# Patient Record
Sex: Female | Born: 1970
Health system: Southern US, Community
[De-identification: ages and names within clinical notes are randomized; demographics above are authoritative.]

## PROBLEM LIST (undated history)

## (undated) DIAGNOSIS — R6252 Short stature (child): Secondary | ICD-10-CM

## (undated) DIAGNOSIS — Z8619 Personal history of other infectious and parasitic diseases: Secondary | ICD-10-CM

## (undated) DIAGNOSIS — K5909 Other constipation: Secondary | ICD-10-CM

## (undated) DIAGNOSIS — O09529 Supervision of elderly multigravida, unspecified trimester: Secondary | ICD-10-CM

## (undated) DIAGNOSIS — Z8669 Personal history of other diseases of the nervous system and sense organs: Secondary | ICD-10-CM

## (undated) DIAGNOSIS — D649 Anemia, unspecified: Secondary | ICD-10-CM

## (undated) DIAGNOSIS — IMO0002 Reserved for concepts with insufficient information to code with codable children: Secondary | ICD-10-CM

## (undated) HISTORY — DX: Short stature (child): R62.52

## (undated) HISTORY — DX: Personal history of other diseases of the nervous system and sense organs: Z86.69

## (undated) HISTORY — DX: Personal history of other infectious and parasitic diseases: Z86.19

## (undated) HISTORY — DX: Supervision of elderly multigravida, unspecified trimester: O09.529

## (undated) HISTORY — DX: Other constipation: K59.09

## (undated) HISTORY — DX: Reserved for concepts with insufficient information to code with codable children: IMO0002

## (undated) HISTORY — DX: Anemia, unspecified: D64.9

---
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
## 1993-09-03 HISTORY — PX: OTHER SURGICAL HISTORY: SHX169

## 2000-12-27 ENCOUNTER — Encounter: Payer: Self-pay | Admitting: Infectious Diseases

## 2000-12-27 ENCOUNTER — Ambulatory Visit (HOSPITAL_COMMUNITY): Admission: RE | Admit: 2000-12-27 | Discharge: 2000-12-27 | Payer: Self-pay | Admitting: Infectious Diseases

## 2004-05-30 ENCOUNTER — Other Ambulatory Visit: Admission: RE | Admit: 2004-05-30 | Discharge: 2004-05-30 | Payer: Self-pay | Admitting: Obstetrics and Gynecology

## 2005-07-15 ENCOUNTER — Inpatient Hospital Stay (HOSPITAL_COMMUNITY): Admission: AD | Admit: 2005-07-15 | Discharge: 2005-07-15 | Payer: Self-pay | Admitting: Obstetrics and Gynecology

## 2005-07-23 ENCOUNTER — Encounter: Admission: RE | Admit: 2005-07-23 | Discharge: 2005-09-02 | Payer: Self-pay | Admitting: Obstetrics and Gynecology

## 2005-09-26 ENCOUNTER — Inpatient Hospital Stay (HOSPITAL_COMMUNITY): Admission: AD | Admit: 2005-09-26 | Discharge: 2005-09-28 | Payer: Self-pay | Admitting: Obstetrics and Gynecology

## 2006-02-18 ENCOUNTER — Emergency Department (HOSPITAL_COMMUNITY): Admission: EM | Admit: 2006-02-18 | Discharge: 2006-02-18 | Payer: Self-pay | Admitting: Family Medicine

## 2006-07-02 ENCOUNTER — Other Ambulatory Visit: Admission: RE | Admit: 2006-07-02 | Discharge: 2006-07-02 | Payer: Self-pay | Admitting: Obstetrics and Gynecology

## 2006-09-27 ENCOUNTER — Emergency Department (HOSPITAL_COMMUNITY): Admission: EM | Admit: 2006-09-27 | Discharge: 2006-09-27 | Payer: Self-pay | Admitting: Family Medicine

## 2007-01-01 ENCOUNTER — Emergency Department (HOSPITAL_COMMUNITY): Admission: EM | Admit: 2007-01-01 | Discharge: 2007-01-01 | Payer: Self-pay | Admitting: Family Medicine

## 2007-01-27 ENCOUNTER — Emergency Department (HOSPITAL_COMMUNITY): Admission: EM | Admit: 2007-01-27 | Discharge: 2007-01-27 | Payer: Self-pay | Admitting: Family Medicine

## 2007-09-05 ENCOUNTER — Ambulatory Visit (HOSPITAL_COMMUNITY): Admission: RE | Admit: 2007-09-05 | Discharge: 2007-09-05 | Payer: Self-pay | Admitting: Emergency Medicine

## 2007-09-05 ENCOUNTER — Emergency Department (HOSPITAL_COMMUNITY): Admission: EM | Admit: 2007-09-05 | Discharge: 2007-09-05 | Payer: Self-pay | Admitting: Emergency Medicine

## 2008-06-08 ENCOUNTER — Ambulatory Visit (HOSPITAL_COMMUNITY): Admission: RE | Admit: 2008-06-08 | Discharge: 2008-06-08 | Payer: Self-pay | Admitting: Neurology

## 2008-06-08 ENCOUNTER — Other Ambulatory Visit: Admission: RE | Admit: 2008-06-08 | Discharge: 2008-06-08 | Payer: Self-pay | Admitting: Family Medicine

## 2008-10-29 ENCOUNTER — Emergency Department (HOSPITAL_COMMUNITY): Admission: EM | Admit: 2008-10-29 | Discharge: 2008-10-29 | Payer: Self-pay | Admitting: Emergency Medicine

## 2009-12-27 ENCOUNTER — Ambulatory Visit (HOSPITAL_COMMUNITY): Admission: RE | Admit: 2009-12-27 | Discharge: 2009-12-27 | Payer: Self-pay | Admitting: Family Medicine

## 2010-07-19 ENCOUNTER — Encounter
Admission: RE | Admit: 2010-07-19 | Discharge: 2010-07-19 | Payer: Self-pay | Source: Home / Self Care | Attending: Obstetrics and Gynecology | Admitting: Obstetrics and Gynecology

## 2010-09-13 ENCOUNTER — Inpatient Hospital Stay (HOSPITAL_COMMUNITY)
Admission: AD | Admit: 2010-09-13 | Discharge: 2010-09-15 | Payer: Self-pay | Source: Home / Self Care | Attending: Obstetrics and Gynecology | Admitting: Obstetrics and Gynecology

## 2010-09-18 LAB — CBC
HCT: 38.3 % (ref 36.0–46.0)
HCT: 30.2 % — ABNORMAL LOW (ref 36.0–46.0)
Hemoglobin: 13.2 g/dL (ref 12.0–15.0)
Hemoglobin: 10.2 g/dL — ABNORMAL LOW (ref 12.0–15.0)
MCH: 28.8 pg (ref 26.0–34.0)
MCH: 28.4 pg (ref 26.0–34.0)
MCHC: 34.5 g/dL (ref 30.0–36.0)
MCHC: 33.8 g/dL (ref 30.0–36.0)
MCV: 83.4 fL (ref 78.0–100.0)
MCV: 84.1 fL (ref 78.0–100.0)
Platelets: 161 10*3/uL (ref 150–400)
Platelets: 142 10*3/uL — ABNORMAL LOW (ref 150–400)
RBC: 4.59 MIL/uL (ref 3.87–5.11)
RBC: 3.59 MIL/uL — ABNORMAL LOW (ref 3.87–5.11)
RDW: 15.1 % (ref 11.5–15.5)
RDW: 15.3 % (ref 11.5–15.5)
WBC: 11.6 10*3/uL — ABNORMAL HIGH (ref 4.0–10.5)
WBC: 12.9 10*3/uL — ABNORMAL HIGH (ref 4.0–10.5)

## 2010-09-18 LAB — GLUCOSE, CAPILLARY
Glucose-Capillary: 81 mg/dL (ref 70–99)
Glucose-Capillary: 86 mg/dL (ref 70–99)
Glucose-Capillary: 118 mg/dL — ABNORMAL HIGH (ref 70–99)
Glucose-Capillary: 89 mg/dL (ref 70–99)

## 2010-09-18 LAB — RPR: RPR Ser Ql: NONREACTIVE

## 2010-09-24 ENCOUNTER — Encounter: Payer: Self-pay | Admitting: Family Medicine

## 2010-09-24 ENCOUNTER — Encounter: Payer: Self-pay | Admitting: Neurology

## 2010-12-12 ENCOUNTER — Other Ambulatory Visit (HOSPITAL_COMMUNITY): Payer: Self-pay | Admitting: Neurosurgery

## 2010-12-12 DIAGNOSIS — M545 Low back pain: Secondary | ICD-10-CM

## 2010-12-14 ENCOUNTER — Ambulatory Visit (HOSPITAL_COMMUNITY)
Admission: RE | Admit: 2010-12-14 | Discharge: 2010-12-14 | Disposition: A | Discharge status: Home / Self Care | Payer: 59 | Source: Ambulatory Visit | Attending: Neurosurgery | Admitting: Neurosurgery

## 2010-12-14 DIAGNOSIS — M48061 Spinal stenosis, lumbar region without neurogenic claudication: Secondary | ICD-10-CM | POA: Insufficient documentation

## 2010-12-14 DIAGNOSIS — M169 Osteoarthritis of hip, unspecified: Secondary | ICD-10-CM | POA: Insufficient documentation

## 2010-12-14 DIAGNOSIS — M545 Low back pain, unspecified: Secondary | ICD-10-CM

## 2010-12-14 DIAGNOSIS — R609 Edema, unspecified: Secondary | ICD-10-CM | POA: Insufficient documentation

## 2010-12-14 DIAGNOSIS — M1610 Unilateral primary osteoarthritis, unspecified hip: Secondary | ICD-10-CM | POA: Insufficient documentation

## 2010-12-14 DIAGNOSIS — M519 Unspecified thoracic, thoracolumbar and lumbosacral intervertebral disc disorder: Secondary | ICD-10-CM | POA: Insufficient documentation

## 2010-12-14 DIAGNOSIS — M51379 Other intervertebral disc degeneration, lumbosacral region without mention of lumbar back pain or lower extremity pain: Secondary | ICD-10-CM | POA: Insufficient documentation

## 2010-12-14 DIAGNOSIS — M5137 Other intervertebral disc degeneration, lumbosacral region: Secondary | ICD-10-CM | POA: Insufficient documentation

## 2010-12-19 LAB — DIFFERENTIAL
Basophils Absolute: 0 10*3/uL (ref 0.0–0.1)
Basophils Relative: 1 % (ref 0–1)
Eosinophils Absolute: 0.1 10*3/uL (ref 0.0–0.7)
Eosinophils Relative: 2 % (ref 0–5)
Lymphocytes Relative: 22 % (ref 12–46)
Lymphs Abs: 1.6 10*3/uL (ref 0.7–4.0)
Monocytes Absolute: 0.4 10*3/uL (ref 0.1–1.0)
Monocytes Relative: 6 % (ref 3–12)
Neutro Abs: 5.1 10*3/uL (ref 1.7–7.7)
Neutrophils Relative %: 70 % (ref 43–77)

## 2010-12-19 LAB — CBC
HCT: 36 % (ref 36.0–46.0)
Hemoglobin: 12.6 g/dL (ref 12.0–15.0)
MCHC: 34.8 g/dL (ref 30.0–36.0)
MCV: 87.7 fL (ref 78.0–100.0)
Platelets: 267 10*3/uL (ref 150–400)
RBC: 4.11 MIL/uL (ref 3.87–5.11)
RDW: 12.9 % (ref 11.5–15.5)
WBC: 7.2 10*3/uL (ref 4.0–10.5)

## 2010-12-19 LAB — URINALYSIS, ROUTINE W REFLEX MICROSCOPIC
Bilirubin Urine: NEGATIVE
Glucose, UA: NEGATIVE mg/dL
Hgb urine dipstick: NEGATIVE
Ketones, ur: NEGATIVE mg/dL
Nitrite: NEGATIVE
Protein, ur: NEGATIVE mg/dL
Specific Gravity, Urine: 1.014 (ref 1.005–1.030)
Urobilinogen, UA: 0.2 mg/dL (ref 0.0–1.0)
pH: 6 (ref 5.0–8.0)

## 2010-12-19 LAB — COMPREHENSIVE METABOLIC PANEL
ALT: 15 U/L (ref 0–35)
AST: 23 U/L (ref 0–37)
Albumin: 3.8 g/dL (ref 3.5–5.2)
Alkaline Phosphatase: 52 U/L (ref 39–117)
BUN: 9 mg/dL (ref 6–23)
CO2: 27 mEq/L (ref 19–32)
Calcium: 9.1 mg/dL (ref 8.4–10.5)
Chloride: 105 mEq/L (ref 96–112)
Creatinine, Ser: 0.77 mg/dL (ref 0.4–1.2)
GFR calc Af Amer: >60 mL/min (ref >60)
GFR calc non Af Amer: >60 mL/min (ref >60)
Glucose, Bld: 172 mg/dL — ABNORMAL HIGH (ref 70–99)
Potassium: 3.4 mEq/L — ABNORMAL LOW (ref 3.5–5.1)
Sodium: 137 mEq/L (ref 135–145)
Total Bilirubin: 0.3 mg/dL (ref 0.3–1.2)
Total Protein: 6.8 g/dL (ref 6.0–8.3)

## 2010-12-19 LAB — PREGNANCY, URINE: Preg Test, Ur: NEGATIVE

## 2010-12-19 LAB — LIPASE, BLOOD: Lipase: 40 U/L (ref 11–59)

## 2010-12-19 LAB — AMYLASE: Amylase: 124 U/L (ref 27–131)

## 2011-01-19 NOTE — H&P (Signed)
NAMEPRISTINE, GLADHILL              ACCOUNT NO.:  [id number]   MEDICAL RECORD NO.:  [id number]          PATIENT TYPE:  INP   LOCATION:  9170                          FACILITY:  [facility]   PHYSICIAN:  Debra Martin, M.D. DATE OF BIRTH:  05-Aug-1971   DATE OF ADMISSION:  09/26/2005  DATE OF DISCHARGE:                                HISTORY & PHYSICAL   Debra Martin is a 40 year old gravida 1, para 0, at 23 weeks, who presents  for induction secondary to gestational diabetes, diet-controlled.  She  reports uterine contractions every four to eight minutes, 60 seconds in  duration, mild to moderate quality, since approximately 3 a.m.  She also  reports CBGs have all been within normal limits.  The pregnancy has been  remarkable for:   1.  Gestational diabetes, diet-controlled.  2.  History of migraines.  3.  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  4.  Marginal cord insertion on ultrasound.   PRENATAL LABORATORY DATA:  Blood type is O positive, Rh antibody  negative.  VDRL nonreactive.  Rubella titer positive.  Hepatitis B surface antigen  negative.  HIV nonreactive.  Sickle cell test was negative.  Cystic fibrosis  testing was negative.  Pap was normal in September 2005.  GC and Chlamydia  cultures were declined.  The patient also declined first trimester  screening.  Hemoglobin upon entering the practice was 13.2.  It was 10.3 at  26 weeks.  The patient's one-hour Glucola was elevated at 147.  Her three-  hour GTT was abnormal except for a fasting blood sugar of 89.  She was  referred to nutrition management center.  She reports all her fasting blood  sugars and two-hour postprandials have been within normal limits.  Group B  strep culture was negative at 36 weeks.  Quadruple screen was declined.  EDC  of October 03, 2005, was established by last menstrual period and was in  agreement with ultrasound at approximately 18 weeks.   HISTORY OF PRESENT PREGNANCY:   The patient entered care at approximately 10  weeks.  She denied first trimester screening and quadruple screen.  She was  on Reglan for nausea.  She was given Midrin for migraines.  She was also  evaluated by an ophthalmologist, which was normal.  She had an ultrasound at  19 weeks showing normal growth and development.  There was a questionable  marginal insertion of the umbilical cord.  This was reevaluated at 22 weeks.  This was confirmed at [facility], and she had another ultrasound at 30  weeks showing normal growth at the 25th percentile.  She had another follow-  up at 36 weeks showing growth at the 11th percentile.  GC and Chlamydia were  negative at 36 weeks.  The baby was breech at 34 weeks but then was vertex  following.  The rest of her pregnancy was essentially uncomplicated.   OBSTETRICAL HISTORY:  The patient is a primigravida.   MEDICAL HISTORY:  She reports the usual childhood illnesses.  She has a  history of migraines.  She has no known medication allergies.   FAMILY HISTORY:  Her brother has heart disease.  Her paternal uncle is  hypertensive on medications.  Her paternal uncle also had a stroke.   SURGICAL HISTORY:  Right breast benign cyst removal in 1995.   GENETIC HISTORY:  Unremarkable.   SOCIAL HISTORY:  The patient is married to the father of the baby.  He is  involved and supportive.  His name is Debra Martin.  The patient is college-  educated.  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  Her husband is also college-educated and is a Engineer, civil (consulting) who works in the  outpatient clinic at [employer].  He is also a Education officer, environmental.  The patient denies  any alcohol, drug or tobacco use during this pregnancy.  She has been  followed by the physician service at [facility].   PHYSICAL EXAMINATION:  VITAL SIGNS:  Stable.  The patient is afebrile.  HEENT:  Within normal  limits.  LUNGS:  Bilateral breath sounds are clear.  CARDIAC:  Regular rate and rhythm without murmur.  BREASTS:  Soft and nontender.  ABDOMEN:  Fundal height is approximately 37 cm.  Estimated fetal weight is 6  pounds.  Uterine contractions are every four to eight minutes, mild to  moderate quality.  PELVIC:  Cervix is 1 cm, 80%, vertex at a -2 station.  EXTREMITIES:  Deep tendon reflexes are 2+ without clonus.  There is a trace  edema noted.   IMPRESSION:  1.  Intrauterine pregnancy at 39 weeks.  2.  Gestational diabetes.   PLAN:  1.  Admit to birthing suite per consult with Dr. Normand Martin as attending      physician.  2.  Routine physician orders.  3.  Plan Pitocin per low-dose protocol.  4.  Patient desires epidural as labor progresses.  5.  Check serum glucose with admit labs and CBGs q.4h.      Debra Martin, C.N.M.      Debra Martin, M.D.  Electronically Signed    VLL/MEDQ  D:  09/26/2005  T:  09/26/2005  Job:  [id number]

## 2012-03-18 ENCOUNTER — Other Ambulatory Visit: Payer: Self-pay | Admitting: Obstetrics and Gynecology

## 2012-03-18 ENCOUNTER — Ambulatory Visit (INDEPENDENT_AMBULATORY_CARE_PROVIDER_SITE_OTHER): Payer: 59 | Admitting: Obstetrics and Gynecology

## 2012-03-18 ENCOUNTER — Ambulatory Visit: Payer: Self-pay | Admitting: Obstetrics and Gynecology

## 2012-03-18 ENCOUNTER — Encounter: Payer: Self-pay | Admitting: Obstetrics and Gynecology

## 2012-03-18 VITALS — BP 90/58 | HR 84 | Ht 59.0 in | Wt 119.0 lb

## 2012-03-18 DIAGNOSIS — M549 Dorsalgia, unspecified: Secondary | ICD-10-CM

## 2012-03-18 DIAGNOSIS — Z124 Encounter for screening for malignant neoplasm of cervix: Secondary | ICD-10-CM

## 2012-03-18 DIAGNOSIS — Z1231 Encounter for screening mammogram for malignant neoplasm of breast: Secondary | ICD-10-CM

## 2012-03-18 DIAGNOSIS — G8929 Other chronic pain: Secondary | ICD-10-CM

## 2012-03-18 NOTE — Progress Notes (Signed)
Last Pap: 02/20/11 WNL: Yes Regular Periods:yes Contraception: Paragard inserted 11/2010  Monthly Breast exam:yes Tetanus<41yrs:yes Nl.Bladder Function:yes Daily BMs:yes Healthy Diet:yes Calcium:yes Mammogram:yes Date of Mammogram: 2011 per pt Exercise:yes Have often Exercise: sometimes Seatbelt: yes Abuse at home: no Stressful work: no Sigmoid-colonoscopy: n/a Bone Density: No PCP: Dr. Sigmund Hazel Change in PMH: none Change in ZOX:WRUE Pt without complaints Physical Examination: General appearance - alert, well appearing, and in no distress Mental status - normal mood, behavior, speech, dress, motor activity, and thought processes Neck - supple, no significant adenopathy, thyroid exam: thyroid is normal in size without nodules or tenderness Chest - clear to auscultation, no wheezes, rales or rhonchi, symmetric air entry Heart - normal rate and regular rhythm Abdomen - soft, nontender, nondistended, no masses or organomegaly Breasts - breasts appear normal, no suspicious masses, no skin or nipple changes or axillary nodes Pelvic - normal external genitalia, vulva, vagina, cervix, uterus and adnexa Rectal - normal rectal, no masses, rectal exam not indicated Back exam - full range of motion, no tenderness, palpable spasm or pain on motion Neurological - alert, oriented, normal speech, no focal findings or movement disorder noted Musculoskeletal - no joint tenderness, deformity or swelling Extremities - no edema, redness or tenderness in the calves or thighs Skin - normal coloration and turgor, no rashes, no suspicious skin lesions noted Routine exam Pap sent yes next one due in 3 yrs Mammogram due yes pt will schedule it  paragard in place RT 1 yr

## 2012-03-19 LAB — PAP IG W/ RFLX HPV ASCU

## 2012-04-08 ENCOUNTER — Ambulatory Visit (HOSPITAL_COMMUNITY)
Admission: RE | Admit: 2012-04-08 | Discharge: 2012-04-08 | Disposition: A | Discharge status: Home / Self Care | Payer: 59 | Source: Ambulatory Visit | Attending: Obstetrics and Gynecology | Admitting: Obstetrics and Gynecology

## 2012-04-08 DIAGNOSIS — Z1231 Encounter for screening mammogram for malignant neoplasm of breast: Secondary | ICD-10-CM | POA: Insufficient documentation

## 2012-06-16 ENCOUNTER — Encounter: Payer: Self-pay | Admitting: Obstetrics and Gynecology

## 2013-03-17 ENCOUNTER — Other Ambulatory Visit (HOSPITAL_BASED_OUTPATIENT_CLINIC_OR_DEPARTMENT_OTHER): Payer: Self-pay | Admitting: Family Medicine

## 2013-03-17 DIAGNOSIS — Z1231 Encounter for screening mammogram for malignant neoplasm of breast: Secondary | ICD-10-CM

## 2013-04-15 ENCOUNTER — Ambulatory Visit (HOSPITAL_BASED_OUTPATIENT_CLINIC_OR_DEPARTMENT_OTHER)
Admission: RE | Admit: 2013-04-15 | Discharge: 2013-04-15 | Disposition: A | Discharge status: Home / Self Care | Payer: 59 | Source: Ambulatory Visit | Attending: Family Medicine | Admitting: Family Medicine

## 2013-04-15 DIAGNOSIS — Z1231 Encounter for screening mammogram for malignant neoplasm of breast: Secondary | ICD-10-CM | POA: Insufficient documentation

## 2013-08-11 ENCOUNTER — Other Ambulatory Visit (HOSPITAL_COMMUNITY): Payer: Self-pay | Admitting: Otolaryngology

## 2013-08-11 DIAGNOSIS — H6980 Other specified disorders of Eustachian tube, unspecified ear: Secondary | ICD-10-CM

## 2013-08-11 DIAGNOSIS — H6981 Other specified disorders of Eustachian tube, right ear: Secondary | ICD-10-CM

## 2013-08-20 ENCOUNTER — Ambulatory Visit (HOSPITAL_COMMUNITY)
Admission: RE | Admit: 2013-08-20 | Discharge: 2013-08-20 | Disposition: A | Discharge status: Home / Self Care | Payer: 59 | Source: Ambulatory Visit | Attending: Otolaryngology | Admitting: Otolaryngology

## 2013-08-20 DIAGNOSIS — H6981 Other specified disorders of Eustachian tube, right ear: Secondary | ICD-10-CM

## 2013-08-20 DIAGNOSIS — H6980 Other specified disorders of Eustachian tube, unspecified ear: Secondary | ICD-10-CM | POA: Insufficient documentation

## 2013-08-20 DIAGNOSIS — H6990 Unspecified Eustachian tube disorder, unspecified ear: Secondary | ICD-10-CM | POA: Insufficient documentation

## 2013-08-20 DIAGNOSIS — H9319 Tinnitus, unspecified ear: Secondary | ICD-10-CM | POA: Insufficient documentation

## 2014-03-16 ENCOUNTER — Other Ambulatory Visit (HOSPITAL_BASED_OUTPATIENT_CLINIC_OR_DEPARTMENT_OTHER): Payer: Self-pay | Admitting: Family Medicine

## 2014-03-16 DIAGNOSIS — Z1231 Encounter for screening mammogram for malignant neoplasm of breast: Secondary | ICD-10-CM

## 2014-04-20 ENCOUNTER — Ambulatory Visit (HOSPITAL_COMMUNITY)
Admission: RE | Admit: 2014-04-20 | Discharge: 2014-04-20 | Disposition: A | Discharge status: Home / Self Care | Payer: 59 | Source: Ambulatory Visit | Attending: Family Medicine | Admitting: Family Medicine

## 2014-04-20 DIAGNOSIS — Z1231 Encounter for screening mammogram for malignant neoplasm of breast: Secondary | ICD-10-CM | POA: Insufficient documentation

## 2014-07-05 ENCOUNTER — Encounter: Payer: Self-pay | Admitting: Obstetrics and Gynecology

## 2014-09-03 ENCOUNTER — Encounter (HOSPITAL_COMMUNITY): Payer: Self-pay | Admitting: Emergency Medicine

## 2014-09-03 ENCOUNTER — Emergency Department (HOSPITAL_COMMUNITY)
Admission: EM | Admit: 2014-09-03 | Discharge: 2014-09-03 | Disposition: A | Discharge status: Home / Self Care | Payer: 59 | Source: Home / Self Care | Attending: Emergency Medicine | Admitting: Emergency Medicine

## 2014-09-03 DIAGNOSIS — J019 Acute sinusitis, unspecified: Secondary | ICD-10-CM

## 2014-09-03 DIAGNOSIS — J040 Acute laryngitis: Secondary | ICD-10-CM

## 2014-09-03 MED ORDER — HYDROCOD POLST-CHLORPHEN POLST 10-8 MG/5ML PO LQCR: 5.0000 mL | Freq: Two times a day (BID) | ORAL | Status: DC | PRN

## 2014-09-03 MED ORDER — AMOXICILLIN-POT CLAVULANATE 875-125 MG PO TABS: 1.0000 | ORAL_TABLET | Freq: Two times a day (BID) | ORAL | Status: DC

## 2014-09-03 MED ORDER — IPRATROPIUM BROMIDE 0.06 % NA SOLN: 2.0000 | Freq: Four times a day (QID) | NASAL | Status: DC

## 2014-09-03 MED ORDER — PREDNISONE 20 MG PO TABS: 20.0000 mg | ORAL_TABLET | Freq: Two times a day (BID) | ORAL | Status: DC

## 2014-09-03 MED ORDER — ALBUTEROL SULFATE HFA 108 (90 BASE) MCG/ACT IN AERS: 1.0000 | INHALATION_SPRAY | Freq: Four times a day (QID) | RESPIRATORY_TRACT | Status: DC | PRN

## 2014-09-03 NOTE — ED Provider Notes (Signed)
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 She denies any fever, chills, headache, sore throat, stiff neck, or GI symptoms.  Review of Systems   Other than as noted above, the patient denies any of the following symptoms: Systemic:  No fevers, chills, sweats, or myalgias. Eye:  No redness or discharge. ENT:  No ear pain, headache, nasal congestion, drainage, sinus pressure, or sore throat. Neck:  No neck pain, stiffness, or swollen glands. Lungs:  No cough, sputum production, hemoptysis, wheezing, chest tightness, shortness of breath or chest pain. GI:  No abdominal pain, nausea, vomiting or diarrhea.  [place]   Past medical history, family history, social history, meds, and allergies were reviewed. Her only medication is Zyrtec.  Physical exam   Vital signs:  BP 127/82 mmHg  Pulse 97  Temp(Src) 98.4 F (36.9 C) (Oral)  Resp 16  SpO2 99%  LMP 08/12/2014 General:  Alert and oriented.  In no distress.  Skin warm and dry. Eye:  No conjunctival injection or drainage. Lids were normal. ENT:  TMs and canals were normal, without erythema or inflammation.  Nasal mucosa was clear and uncongested, without drainage.  Mucous membranes were moist.  Pharynx was clear with no exudate or drainage.  There were no oral ulcerations or lesions. Neck:  Supple, no adenopathy, tenderness or mass. Lungs:  No respiratory distress.  Lungs were clear to auscultation, without wheezes, rales or rhonchi.  Breath sounds were clear and equal bilaterally.  Heart:  Regular rhythm, without gallops, murmers or rubs. Skin:  Clear, warm, and dry, without rash or lesions.  Assessment     The primary encounter diagnosis was Acute sinusitis, recurrence not specified, unspecified location. A diagnosis of Laryngitis was also  pertinent to this visit.  Plan    1.  Meds:  The following meds were prescribed:   Discharge Medication List as of 09/03/2014 10:41 AM    START taking these medications   Details  albuterol (PROVENTIL HFA;VENTOLIN HFA) 108 (90 BASE) MCG/ACT inhaler Inhale 1-2 puffs into the lungs every 6 (six) hours as needed for wheezing or shortness of breath., Starting 09/03/2014, Until Discontinued, Normal    amoxicillin-clavulanate (AUGMENTIN) 875-125 MG per tablet Take 1 tablet by mouth 2 (two) times daily., Starting 09/03/2014, Until Discontinued, Normal    chlorpheniramine-HYDROcodone (TUSSIONEX) 10-8 MG/5ML LQCR Take 5 mLs by mouth every 12 (twelve) hours as needed for cough., Starting 09/03/2014, Until Discontinued, Normal    ipratropium (ATROVENT) 0.06 % nasal spray Place 2 sprays into both nostrils 4 (four) times daily., Starting 09/03/2014, Until Discontinued, Normal    predniSONE (DELTASONE) 20 MG tablet Take 1 tablet (20 mg total) by mouth 2 (two) times daily., Starting 09/03/2014, Until Discontinued, Normal        2.  Patient Education/Counseling:  The patient was given appropriate handouts, self care instructions, and instructed in symptomatic relief.  Instructed to get extra fluids and extra rest.    3.  Follow up:  The patient was told to follow up here if no better in 3 to 4 days, or sooner if becoming worse in any way, and given some red flag symptoms such as increasing fever, difficulty breathing, chest pain, or persistent vomiting which would prompt immediate return.       Harden Mo, MD 09/03/14  1105 

## 2014-09-03 NOTE — Discharge Instructions (Signed)
Most upper respiratory infections are caused by viruses and do not require antibiotics.  We try to save the antibiotics for when we really need them to prevent bacteria from developing resistance to them.  Here are a few hints about things that can be done at home to help get over an upper respiratory infection quicker: ° °Get extra sleep and extra fluids.  Get 7 to 9 hours of sleep per night and 6 to 8 glasses of water a day.  Getting extra sleep keeps the immune system from getting run down.  Most people with an upper respiratory infection are a little dehydrated.  The extra fluids also keep the secretions liquified and easier to deal with.  Also, get extra vitamin C.  4000 mg per day is the recommended dose. °For the aches, headache, and fever, acetaminophen or ibuprofen are helpful.  These can be alternated every 4 hours.  People with liver disease should avoid large amounts of acetaminophen, and people with ulcer disease, gastroesophageal reflux, gastritis, congestive heart failure, chronic kidney disease, coronary artery disease and the elderly should avoid ibuprofen. °For nasal congestion try Mucinex-D, or if you're having lots of sneezing or clear nasal drainage use Zyrtec-D. People with high blood pressure can take these if their blood pressure is controlled, if not, it's best to avoid the forms with a "D" (decongestants).  You can use the plain Mucinex, Allegra, Claritin, or Zyrtec even if your blood pressure is not controlled.   °A Saline nasal spray such as Ocean Spray can also help.  You can add a decongestant sprays such as Afrin, but you should not use the decongestant sprays for more than 3 or 4 days since they can be habituating.  Breathe Rite nasal strips can also offer a non-drug alternative treatment to nasal congestion, especially at night. °For people with symptoms of sinusitis, sleeping with your head elevated can be helpful.  For sinus pain, moist, hot compresses to the face may provide some  relief.  Many people find that inhaling steam as in a shower or from a pot of steaming water can help. °For any viral infection, zinc containing lozenges such as Cold-Eze or Zicam are helpful.  Zinc helps to fight viral infection.  Hot salt water gargles (8 oz of hot water, 1/2 tsp of table salt, and a pinch of baking soda) can give relief as well as hot beverages such as hot tea.  Sucrets extra strength lozenges will help the sore throat.  °For the cough, take Delsym 2 tsp every 12 hours.  It has also been found recently that Aleve can help control a cough.  The dose is 1 to 2 tablets twice daily with food.  This can be combined with Delsym. (Note, if you are taking ibuprofen, you should not take Aleve as well--take one or the other.) °A cool mist vaporizer will help keep your mucous membranes from drying out.  ° °It's important when you have an upper respiratory infection not to pass the infection to others.  This involves being very careful about the following: ° °Frequent hand washing or use of hand sanitizer, especially after coughing, sneezing, blowing your nose or touching your face, nose or eyes. °Do not shake hands or touch anyone and try to avoid touching surfaces that other people use such as doorknobs, shopping carts, telephones and computer keyboards. °Use tissues and dispose of them properly in a garbage can or ziplock bag. °Cough into your sleeve. °Do not let others eat or   drink after you. ° °It's also important to recognize the signs of serious illness and get evaluated if they occur: °Any respiratory infection that lasts more than 7 to 10 days.  Yellow nasal drainage and sputum are not reliable indicators of a bacterial infection, but if they last for more than 1 week, see your doctor. °Fever and sore throat can indicate strep. °Fever and cough can indicate influenza or pneumonia. °Any kind of severe symptom such as difficulty breathing, intractable vomiting, or severe pain should prompt you to see  a doctor as soon as possible. ° ° °Your body's immune system is really the thing that will get rid of this infection.  Your immune system is comprised of 2 types of specialized cells called T cells and B cells.  T cells coordinate the array of cells in your body that engulf invading bacteria or viruses while B cells orchestrate the production of antibodies that neutralize infection.  Anything we do or any medications we give you, will just strengthen your immune system or help it clear up the infection quicker.  Here are a few helpful hints to improve your immune system to help overcome this illness or to prevent future infections: °· A few vitamins can improve the health of your immune system.  That's why your diet should include plenty of fruits, vegetables, fish, nuts, and whole grains. °· Vitamin A and bet-carotene can increase the cells that fight infections (T cells and B cells).  Vitamin A is abundant in dark greens and orange vegetables such as spinach, greens, sweet potatoes, and carrots. °· Vitamin B6 contributes to the maturation of white blood cells, the cells that fight disease.  Foods with vitamin B6 include cold cereal and bananas. °· Vitamin C is credited with preventing colds because it increases white blood cells and also prevents cellular damage.  Citrus fruits, peaches and green and red bell peppers are all hight in vitamin C. °· Vitamin E is an anti-oxidant that encourages the production of natural killer cells which reject foreign invaders and B cells that produce antibodies.  Foods high in vitamin E include wheat germ, nuts and seeds. °· Foods high in omega-3 fatty acids found in foods like salmon, tuna and mackerel boost your immune system and help cells to engulf and absorb germs. °· Probiotics are good bacteria that increase your T cells.  These can be found in yogurt and are available in supplements such as Culturelle or Align. °· Moderate exercise increases the strength of your immune  system and your ability to recover from illness.  I suggest 3 to 5 moderate intensity 30 minute workouts per week.   °· Sleep is another component of maintaining a strong immune system.  It enables your body to recuperate from the day's activities, stress and work.  My recommendation is to get between 7 and 9 hours of sleep per night. °· If you smoke, try to quit completely or at least cut down.  Drink alcohol only in moderation if at all.  No more than 2 drinks daily for men or 1 for women. °· Get a flu vaccine early in the fall or if you have not gotten one yet, once this illness has run its course.  If you are over 65, a smoker, or an asthmatic, get a pneumococcal vaccine. °· My final recommendation is to maintain a healthy weight.  Excess weight can impair the immune system by interfering with the way the immune system deals with invading viruses or   bacteria.   Laryngitis At the top of your windpipe is your voice box. It is the source of your voice. Inside your voice box are 2 bands of muscles called vocal cords. When you breathe, your vocal cords are relaxed and open so that air can get into the lungs. When you decide to say something, these cords come together and vibrate. The sound from these vibrations goes into your throat and comes out through your mouth as sound. Laryngitis is an inflammation of the vocal cords that causes hoarseness, cough, loss of voice, sore throat, and dry throat. Laryngitis can be temporary (acute) or long-term (chronic). Most cases of acute laryngitis improve with time.Chronic laryngitis lasts for more than 3 weeks. CAUSES Laryngitis can often be related to excessive smoking, talking, or yelling, as well as inhalation of toxic fumes and allergies. Acute laryngitis is usually caused by a viral infection, vocal strain, measles or mumps, or bacterial infections. Chronic laryngitis is usually caused by vocal cord strain, vocal cord injury, postnasal drip, growths on the vocal  cords, or acid reflux. SYMPTOMS  Cough. Sore throat. Dry throat. RISK FACTORS Respiratory infections. Exposure to irritating substances, such as cigarette smoke, excessive amounts of alcohol, stomach acids, and workplace chemicals. Voice trauma, such as vocal cord injury from shouting or speaking too loud. DIAGNOSIS  Your cargiver will perform a physical exam. During the physical exam, your caregiver will examine your throat. The most common sign of laryngitis is hoarseness. Laryngoscopy may be necessary to confirm the diagnosis of this condition. This procedure allows your caregiver to look into the larynx. HOME CARE INSTRUCTIONS Drink enough fluids to keep your urine clear or pale yellow. Rest until you no longer have symptoms or as directed by your caregiver. Breathe in moist air. Take all medicine as directed by your caregiver. Do not smoke. Talk as little as possible (this includes whispering). Write on paper instead of talking until your voice is back to normal. Follow up with your caregiver if your condition has not improved after 10 days. SEEK MEDICAL CARE IF:  You have trouble breathing. You cough up blood. You have persistent fever. You have increasing pain. You have difficulty swallowing. MAKE SURE YOU: Understand these instructions. Will watch your condition. Will get help right away if you are not doing well or get worse. Document Released: 08/20/2005 Document Revised: 11/12/2011 Document Reviewed: 10/26/2010 [facility] Patient Information 2015 [place], [place]. This information is not intended to replace advice given to you by your health care provider. Make sure you discuss any questions you have with your health care provider.

## 2014-09-03 NOTE — ED Notes (Signed)
C/o cold sx onset 2 days Sx include: congestion, hoarseness, cough Denies fevers, chills.  Taking OTC cold meds w/no relief.  Alert, no signs of acute distress.

## 2015-03-15 ENCOUNTER — Other Ambulatory Visit (HOSPITAL_COMMUNITY): Payer: Self-pay | Admitting: Family Medicine

## 2015-03-15 DIAGNOSIS — Z1231 Encounter for screening mammogram for malignant neoplasm of breast: Secondary | ICD-10-CM

## 2015-03-23 ENCOUNTER — Ambulatory Visit: Payer: 59 | Admitting: Endocrinology

## 2015-04-26 ENCOUNTER — Ambulatory Visit (HOSPITAL_COMMUNITY)
Admission: RE | Admit: 2015-04-26 | Discharge: 2015-04-26 | Disposition: A | Discharge status: Home / Self Care | Payer: 59 | Source: Ambulatory Visit | Attending: Family Medicine | Admitting: Family Medicine

## 2015-04-26 DIAGNOSIS — Z1231 Encounter for screening mammogram for malignant neoplasm of breast: Secondary | ICD-10-CM | POA: Diagnosis not present

## 2015-05-17 ENCOUNTER — Other Ambulatory Visit (HOSPITAL_COMMUNITY)
Admission: RE | Admit: 2015-05-17 | Discharge: 2015-05-17 | Disposition: A | Discharge status: Home / Self Care | Payer: 59 | Source: Ambulatory Visit | Attending: Family Medicine | Admitting: Family Medicine

## 2015-05-17 ENCOUNTER — Other Ambulatory Visit: Payer: Self-pay | Admitting: Family Medicine

## 2015-05-17 DIAGNOSIS — Z124 Encounter for screening for malignant neoplasm of cervix: Secondary | ICD-10-CM | POA: Insufficient documentation

## 2015-05-17 DIAGNOSIS — Z1151 Encounter for screening for human papillomavirus (HPV): Secondary | ICD-10-CM | POA: Insufficient documentation

## 2015-05-19 LAB — CYTOLOGY - PAP

## 2015-11-01 MED FILL — IBUPROFEN 600 MG TABLET: 600 | 10 days supply | Qty: 30 | Fill #0

## 2016-01-17 ENCOUNTER — Other Ambulatory Visit (HOSPITAL_BASED_OUTPATIENT_CLINIC_OR_DEPARTMENT_OTHER): Payer: Self-pay | Admitting: Family Medicine

## 2016-01-17 DIAGNOSIS — Z1231 Encounter for screening mammogram for malignant neoplasm of breast: Secondary | ICD-10-CM

## 2016-01-31 DIAGNOSIS — Z124 Encounter for screening for malignant neoplasm of cervix: Secondary | ICD-10-CM | POA: Diagnosis not present

## 2016-01-31 DIAGNOSIS — Z01419 Encounter for gynecological examination (general) (routine) without abnormal findings: Secondary | ICD-10-CM | POA: Diagnosis not present

## 2016-04-24 DIAGNOSIS — L858 Other specified epidermal thickening: Secondary | ICD-10-CM | POA: Diagnosis not present

## 2016-04-24 DIAGNOSIS — D220 Melanocytic nevi of lip: Secondary | ICD-10-CM | POA: Diagnosis not present

## 2016-04-24 DIAGNOSIS — D225 Melanocytic nevi of trunk: Secondary | ICD-10-CM | POA: Diagnosis not present

## 2016-04-24 DIAGNOSIS — I788 Other diseases of capillaries: Secondary | ICD-10-CM | POA: Diagnosis not present

## 2016-04-24 DIAGNOSIS — D2222 Melanocytic nevi of left ear and external auricular canal: Secondary | ICD-10-CM | POA: Diagnosis not present

## 2016-04-24 DIAGNOSIS — D224 Melanocytic nevi of scalp and neck: Secondary | ICD-10-CM | POA: Diagnosis not present

## 2016-04-24 DIAGNOSIS — D2262 Melanocytic nevi of left upper limb, including shoulder: Secondary | ICD-10-CM | POA: Diagnosis not present

## 2016-04-24 DIAGNOSIS — L821 Other seborrheic keratosis: Secondary | ICD-10-CM | POA: Diagnosis not present

## 2016-04-24 DIAGNOSIS — D2272 Melanocytic nevi of left lower limb, including hip: Secondary | ICD-10-CM | POA: Diagnosis not present

## 2016-04-24 MED FILL — HYDROCORTISONE 2.5% CREAM: 2.5 | 15 days supply | Qty: 30 | Fill #0

## 2016-05-01 ENCOUNTER — Ambulatory Visit (HOSPITAL_BASED_OUTPATIENT_CLINIC_OR_DEPARTMENT_OTHER)
Admission: RE | Admit: 2016-05-01 | Discharge: 2016-05-01 | Disposition: A | Discharge status: Home / Self Care | Payer: 59 | Source: Ambulatory Visit | Attending: Family Medicine | Admitting: Family Medicine

## 2016-05-01 DIAGNOSIS — Z1231 Encounter for screening mammogram for malignant neoplasm of breast: Secondary | ICD-10-CM | POA: Diagnosis not present

## 2016-05-03 ENCOUNTER — Other Ambulatory Visit: Payer: Self-pay | Admitting: Family Medicine

## 2016-05-03 DIAGNOSIS — R928 Other abnormal and inconclusive findings on diagnostic imaging of breast: Secondary | ICD-10-CM

## 2016-05-09 ENCOUNTER — Ambulatory Visit
Admission: RE | Admit: 2016-05-09 | Discharge: 2016-05-09 | Disposition: A | Discharge status: Home / Self Care | Payer: 59 | Source: Ambulatory Visit | Attending: Family Medicine | Admitting: Family Medicine

## 2016-05-09 DIAGNOSIS — N6489 Other specified disorders of breast: Secondary | ICD-10-CM | POA: Diagnosis not present

## 2016-05-09 DIAGNOSIS — R928 Other abnormal and inconclusive findings on diagnostic imaging of breast: Secondary | ICD-10-CM

## 2016-05-09 DIAGNOSIS — R922 Inconclusive mammogram: Secondary | ICD-10-CM | POA: Diagnosis not present

## 2016-05-15 ENCOUNTER — Other Ambulatory Visit: Payer: 59

## 2016-05-22 DIAGNOSIS — Z0000 Encounter for general adult medical examination without abnormal findings: Secondary | ICD-10-CM | POA: Diagnosis not present

## 2016-05-22 DIAGNOSIS — O99810 Abnormal glucose complicating pregnancy: Secondary | ICD-10-CM | POA: Diagnosis not present

## 2016-05-22 DIAGNOSIS — J309 Allergic rhinitis, unspecified: Secondary | ICD-10-CM | POA: Diagnosis not present

## 2016-05-22 DIAGNOSIS — L309 Dermatitis, unspecified: Secondary | ICD-10-CM | POA: Diagnosis not present

## 2016-05-22 DIAGNOSIS — M5136 Other intervertebral disc degeneration, lumbar region: Secondary | ICD-10-CM | POA: Diagnosis not present

## 2016-05-22 DIAGNOSIS — L659 Nonscarring hair loss, unspecified: Secondary | ICD-10-CM | POA: Diagnosis not present

## 2016-05-29 DIAGNOSIS — L282 Other prurigo: Secondary | ICD-10-CM | POA: Diagnosis not present

## 2016-05-29 DIAGNOSIS — L249 Irritant contact dermatitis, unspecified cause: Secondary | ICD-10-CM | POA: Diagnosis not present

## 2016-05-29 MED FILL — TRIAMCINOLONE 0.1% CREAM: 0.1 | 30 days supply | Qty: 454 | Fill #0

## 2016-05-29 MED FILL — ALL DAY ALLERGY 10 MG TAB: 10 | 100 days supply | Qty: 100 | Fill #0 | Status: TO

## 2016-05-29 MED FILL — IBUPROFEN 600 MG TABLET: 600 | 10 days supply | Qty: 30 | Fill #0 | Status: TO

## 2016-06-12 DIAGNOSIS — H524 Presbyopia: Secondary | ICD-10-CM | POA: Diagnosis not present

## 2016-07-12 ENCOUNTER — Ambulatory Visit: Payer: Self-pay | Admitting: Allergy

## 2016-07-16 ENCOUNTER — Ambulatory Visit (INDEPENDENT_AMBULATORY_CARE_PROVIDER_SITE_OTHER): Payer: 59 | Admitting: Allergy

## 2016-07-16 VITALS — BP 116/70 | HR 72 | Resp 16 | Ht 59.0 in | Wt 118.4 lb

## 2016-07-16 DIAGNOSIS — L253 Unspecified contact dermatitis due to other chemical products: Secondary | ICD-10-CM | POA: Diagnosis not present

## 2016-07-16 DIAGNOSIS — J309 Allergic rhinitis, unspecified: Secondary | ICD-10-CM

## 2016-07-16 NOTE — Patient Instructions (Addendum)
1. Contact Dermatitis - You had a positive result for gold thiosulfate.  - There were some other questionable results, however I think they are just irritant reactions.  2. Allergic rhinoconjunctivitis - Testing from the blood work was positive to dust mite, dog, cockroach, oak, pecan, and hickory - Avoidance measures below.  3. Follow-up Friday for final patch reading  Reducing Pollen Exposure  The American Academy of Allergy, Asthma and Immunology suggests the following steps to reduce your exposure to pollen during allergy seasons.    1. Do not hang sheets or clothing out to dry; pollen may collect on these items. 2. Do not mow lawns or spend time around freshly cut grass; mowing stirs up pollen. 3. Keep windows closed at night.  Keep car windows closed while driving. 4. Minimize morning activities outdoors, a time when pollen counts are usually at their highest. 5. Stay indoors as much as possible when pollen counts or humidity is high and on windy days when pollen tends to remain in the air longer. 6. Use air conditioning when possible.  Many air conditioners have filters that trap the pollen spores. 7. Use a HEPA room air filter to remove pollen form the indoor air you breathe.  Control of House Dust Mite Allergen    House dust mites play a major role in allergic asthma and rhinitis.  They occur in environments with high humidity wherever human skin, the food for dust mites is found. High levels have been detected in dust obtained from mattresses, pillows, carpets, upholstered furniture, bed covers, clothes and soft toys.  The principal allergen of the house dust mite is found in its feces.  A gram of dust may contain 1,000 mites and 250,000 fecal particles.  Mite antigen is easily measured in the air during house cleaning activities.    1. Encase mattresses, including the box spring, and pillow, in an air tight cover.  Seal the zipper end of the encased mattresses with wide  adhesive tape. 2. Wash the bedding in water of 130 degrees Farenheit weekly.  Avoid cotton comforters/quilts and flannel bedding: the most ideal bed covering is the dacron comforter. 3. Remove all upholstered furniture from the bedroom. 4. Remove carpets, carpet padding, rugs, and non-washable window drapes from the bedroom.  Wash drapes weekly or use plastic window coverings. 5. Remove all non-washable stuffed toys from the bedroom.  Wash stuffed toys weekly. 6. Have the room cleaned frequently with a vacuum cleaner and a damp dust-mop.  The patient should not be in a room which is being cleaned and should wait 1 hour after cleaning before going into the room. 7. Close and seal all heating outlets in the bedroom.  Otherwise, the room will become filled with dust-laden air.  An electric heater can be used to heat the room. 8. Reduce indoor humidity to less than 50%.  Do not use a humidifier.  Control of Cockroach Allergen  Cockroach allergen has been identified as an important cause of acute attacks of asthma, especially in urban settings.  There are fifty-five species of cockroach that exist in the [place], however only three, the [place], [place] species produce allergen that can affect patients with Asthma.  Allergens can be obtained from fecal particles, egg casings and secretions from cockroaches.    1. Remove food sources. 2. Reduce access to water. 3. Seal access and entry points. 4. Spray runways with 0.5-1% Diazinon or Chlorpyrifos 5. Blow boric acid power under stoves and refrigerator. 6.  Place bait stations (hydramethylnon) at feeding sites.   Control of Dog or Cat Allergen  Avoidance is the best way to manage a dog or cat allergy. If you have a dog or cat and are allergic to dog or cats, consider removing the dog or cat from the home. If you have a dog or cat but don't want to find it a new home, or if your family wants a pet even though someone in the household  is allergic, here are some strategies that may help keep symptoms at bay:  1. Keep the pet out of your bedroom and restrict it to only a few rooms. Be advised that keeping the dog or cat in only one room will not limit the allergens to that room. 2. Don't pet, hug or kiss the dog or cat; if you do, wash your hands with soap and water. 3. High-efficiency particulate air (HEPA) cleaners run continuously in a bedroom or living room can reduce allergen levels over time. 4. Regular use of a high-efficiency vacuum cleaner or a central vacuum can reduce allergen levels. 5. Giving your dog or cat a bath at least once a week can reduce airborne allergen.

## 2016-07-16 NOTE — Progress Notes (Addendum)
New Patient Note  RE: Debra Martin MRN: [id number] DOB: November 26, 1970 Date of Office Visit: 07/16/2016  Referring provider: Kathyrn Lass, MD Primary care provider: Tawanna Solo, MD  Chief Complaint: reaction to surgical scrub cleanser  History of present illness: Debra Martin is a 45 y.o. female presenting today for consultation for reaction to surgical scrub cleanser.    In September 2017 she remembers using a different surgical scrub in soap (avagard) and reports she had a rash where she applied the cleanser.  She is not sure how quickly the rash started as the surgery was several hours she noted the rash when she took off her surgical attire. She reports the rash was itchy however she does take Zyrtec on a daily basis which helped keep the itch under control. The rash was flat.  She did provide pictures of the rash which appeared to be red macules.  The rash went away the same night and redeveloped on torso for several days before clearing.  She also uses betadine scrub which doesn't cause her any issues.  She denies any swelling, hives, joint pain or arthralgias, rash did not leave any marks or bruising.   She also denies any GI, respiratory or CV related symptoms.  She went to employee health and dermatologist due to the rash and was prescribed triamcinolone and cereva.   She stopped Zyrtec last week in preparation for this visit and reports she is very itchy on her torso and breasts but this does not have any visible rash.  She denies any history of hives. The zyrtec helps control her nasal congestion and drainage.  She also use Flonase daily. She reports her nasal symptoms are worse in the spring.  She has never had any allergy testing before. She denies a history of asthma but she has had an albuterol inhaler in the past for cough with illness. She has not needed to use albuterol or other breathing treatments for several years now. She has no history of eczema or drug allergy or  food allergy.  Review of systems: Review of Systems  Constitutional: Negative for chills and fever.  HENT: Positive for congestion. Negative for sinus pain and sore throat.   Eyes: Negative for redness.  Respiratory: Negative for cough, shortness of breath and wheezing.   Cardiovascular: Negative for chest pain.  Gastrointestinal: Negative for heartburn, nausea and vomiting.  Skin: Positive for itching. Negative for rash.    All other systems negative unless noted above in HPI  Past medical history: Past Medical History:  Diagnosis Date  . AMA (advanced maternal age) multigravida 9+   . Anemia   . Bulging disc   . Constipation, chronic   . H/O migraine   . H/O varicella   . Short stature     Past surgical history: Past Surgical History:  Procedure Laterality Date  . remove cyst from breast  1995    Family history:  Family History  Problem Relation Age of Onset  . Asthma Mother   . Heart disease Brother     Stenosis of valves  . Stroke Paternal Uncle   . Hypertension Paternal Uncle     Social history: She is married and lives in a home with a year's with carpeting in the bedroom with electric and gas heating and central cooling. There are no pets in the home. There are no concerns for water damage or mildew or roaches in the home. She is OR scrub nurse.  Social History  Main Topics  . Smoking status: Never Smoker  . Smokeless tobacco: Not on file  . Alcohol use No  . Drug use: No  . Sexual activity: Yes    Birth control/ protection: IUD     Comment: Paragard inserted 11/2010    Medication List:   Medication List       Accurate as of 07/16/16  4:04 PM. Always use your most recent med list.          albuterol 108 (90 Base) MCG/ACT inhaler Commonly known as:  PROVENTIL HFA;VENTOLIN HFA Inhale 1-2 puffs into the lungs every 6 (six) hours as needed for wheezing or shortness of breath.   ibuprofen 600 MG tablet Commonly known as:  ADVIL,MOTRIN Take 600  mg by mouth every 6 (six) hours as needed.   multivitamin with minerals tablet Take 1 tablet by mouth daily.   ZYRTEC PO Take by mouth.       Known medication allergies: No Known Allergies   Physical examination: Blood pressure 116/70, pulse 72, resp. rate 16, height 4\' 11"  (1.499 m), weight 118 lb 6.4 oz (53.7 kg).  General: Alert, interactive, in no acute distress. HEENT: TMs pearly gray, turbinates minimally edematous without discharge, post-pharynx non erythematous. Neck: Supple without lymphadenopathy. Lungs: Clear to auscultation without wheezing, rhonchi or rales. {no increased work of breathing. CV: Normal S1, S2 without murmurs. Abdomen: Nondistended, nontender. Skin: Warm and dry, without lesions or rashes. Extremities:  No clubbing, cyanosis or edema. Neuro:   Grossly intact.  Diagnositics/Labs: Allergy testing:  Deferred due to patch test placement Allergy testing results were read and interpreted by provider, documented by clinical staff.   Assessment and plan:   Contact Dermatitis   - possible contact dermatitis related to scrub-in cleaning solution   - will place TRUE test patches on back today to assess for contact dermatits.   Do not get the patches wet.  Patient also able to obtain sample of the cleansing solution in question which was also placed in a chamber on her back.    - return on Wednesday for initial patch read.     - you may take Zyrtec while patches are in place    - you may use triamcinolone on rash if occurs again after patch testing is completed  Allergic rhinoconjunctivitis   - continue Zyrtec daily   - continue Flonase 2 sprays each nostril daily as needed for nasal congestion or drainage   - continue saline rinse prior to nasal spray use   - obtain environmental allergy panel    Follow-up Wednesday for patch reading  I appreciate the opportunity to take part in Hajar's care. Please do not hesitate to contact me with  questions.  Sincerely,   Prudy Feeler, MD Allergy/Immunology [facility] --------------------------------------------------------------------------------------- Shaguana returns to the office today for the final patch test interpretation, given suspected history of contact dermatitis.    Diagnostics:  TRUE TEST 96 hour reading:  Gold remains erythematous.   No other positive and no evidence of irritation of other chemicals.   Assessment and Plan:   She will avoid gold.   Informational sheet provide at 48hr reading.

## 2016-07-17 LAB — CP584 ZONE 3
ALLERGEN, D PTERNOYSSINUS, D1: 3.39 kU/L — AB
ALLERGEN, OAK, T7: 0.32 kU/L — AB
Allergen, A. alternata, m6: <0.1 kU/L
Allergen, Black Locust, Acacia9: <0.1 kU/L
Allergen, C. Herbarum, M2: <0.1 kU/L
Allergen, Cedar tree, t12: <0.1 kU/L
Allergen, Comm Silver Birch, t9: <0.1 kU/L
Allergen, Mucor Racemosus, M4: <0.1 kU/L
Allergen, S. Botryosum, m10: <0.1 kU/L
Bahia Grass: <0.1 kU/L
Box Elder IgE: <0.1 kU/L
Cat Dander: <0.1 kU/L
Cockroach: 2.63 kU/L — ABNORMAL HIGH
Common Ragweed: <0.1 kU/L
D. FARINAE: 3.15 kU/L — AB
DOG DANDER: 0.12 kU/L — AB
Elm IgE: <0.1 kU/L
Johnson Grass: <0.1 kU/L
Meadow Grass: <0.1 kU/L
Nettle: <0.1 kU/L
PECAN/HICKORY TREE IGE: 0.25 kU/L — AB
Plantain: <0.1 kU/L
Rough Pigweed  IgE: <0.1 kU/L

## 2016-07-18 ENCOUNTER — Encounter: Payer: 59 | Admitting: Allergy & Immunology

## 2016-07-18 NOTE — Progress Notes (Signed)
    Follow-up Note  RE: Debra Martin MRN: [id number] DOB: 12-01-1970 Date of Office Visit: 07/16/2016  Primary care provider: Tawanna Solo, MD Referring provider: Kathyrn Lass, MD   [name] returns to the office today for the initial patch test interpretation, given suspected history of contact dermatitis.    Diagnostics:  TRUE TEST 48 hour reading: strong positive reaction to #28 (gold sodium thiosulfate). She had irritant reactions to #5, #16, #22, #23, #24, #29, and #30.  Plan:  Allergic contact dermatitis  The patient has been provided detailed information regarding the substances she is sensitive to, as well as products containing the substances.  Meticulous avoidance of these substances is recommended. If avoidance is not possible, the use of barrier creams or lotions is recommended. If symptoms persist or progress despite meticulous avoidance of gold sodium thiosulfate, dermatology evaluation may be warranted.

## 2016-07-20 ENCOUNTER — Encounter: Payer: 59 | Admitting: Allergy

## 2016-08-29 MED FILL — IBUPROFEN 600 MG TABLET: 600 | 10 days supply | Qty: 30 | Fill #0

## 2016-08-29 MED FILL — ALL DAY ALLERGY 10 MG TAB: 10 | 100 days supply | Qty: 100 | Fill #0

## 2016-08-29 MED FILL — FLUTICASONE PROP 50 MCG SPR: 50 | 90 days supply | Qty: 32 | Fill #0

## 2017-03-12 ENCOUNTER — Ambulatory Visit (INDEPENDENT_AMBULATORY_CARE_PROVIDER_SITE_OTHER): Payer: 59 | Admitting: Orthopaedic Surgery

## 2017-03-12 ENCOUNTER — Other Ambulatory Visit: Payer: Self-pay | Admitting: Family Medicine

## 2017-03-12 DIAGNOSIS — Z1231 Encounter for screening mammogram for malignant neoplasm of breast: Secondary | ICD-10-CM

## 2017-03-26 MED FILL — SF 5000 PLUS CREAM: 1.1 | 30 days supply | Qty: 51 | Fill #0

## 2017-04-30 ENCOUNTER — Encounter: Payer: Self-pay | Admitting: Allergy and Immunology

## 2017-04-30 ENCOUNTER — Ambulatory Visit (INDEPENDENT_AMBULATORY_CARE_PROVIDER_SITE_OTHER): Payer: 59 | Admitting: Allergy and Immunology

## 2017-04-30 DIAGNOSIS — Z91018 Allergy to other foods: Secondary | ICD-10-CM | POA: Diagnosis not present

## 2017-04-30 DIAGNOSIS — H1013 Acute atopic conjunctivitis, bilateral: Secondary | ICD-10-CM | POA: Diagnosis not present

## 2017-04-30 DIAGNOSIS — J3089 Other allergic rhinitis: Secondary | ICD-10-CM | POA: Diagnosis not present

## 2017-04-30 DIAGNOSIS — T7800XA Anaphylactic reaction due to unspecified food, initial encounter: Secondary | ICD-10-CM | POA: Insufficient documentation

## 2017-04-30 DIAGNOSIS — J302 Other seasonal allergic rhinitis: Secondary | ICD-10-CM | POA: Insufficient documentation

## 2017-04-30 DIAGNOSIS — H1010 Acute atopic conjunctivitis, unspecified eye: Secondary | ICD-10-CM | POA: Insufficient documentation

## 2017-04-30 NOTE — Progress Notes (Signed)
    Follow-up Note  RE: Debra Martin MRN: [id number] DOB: Apr 14, 1971 Date of Office Visit: 04/30/2017  Primary care provider: Kathyrn Lass, MD Referring provider: Kathyrn Lass, MD  History of present illness: Debra Martin is a 46 y.o. female with allergic contact dermatitis presenting today for a new problem.  She was last seen in this clinic by Dr. Ernst Bowler in November 2017.  She reports that approximately 6 weeks ago she consumed crab and within an hour developed hives on her upper extremities and abdomen.  She did not experience concomitant angioedema, cardiopulmonary symptoms, or GI symptoms.  Approximately 3 weeks ago she consumed shrimp and fish and within an hour developed eyelid angioedema.  She took diphenhydramine as soon as she noticed symptoms and the symptoms resolved without further medical intervention.  She also states that when she consumes strawberries, bananas, or avocado she experiences oral pruritus.  She experiences nasal congestion, rhinorrhea, sneezing, nasal pruritus, and ocular pruritus.  These nasal and ocular symptoms are most frequent and severe with pollen exposure.   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 We were unable to perform skin tests today due to recent administration of antihistamine.   Debra Martin will return in the near future for skin testing after having been off of antihistamines for at least 3 days.  Recommendations will be made based on those results.  For now, carefully avoid shellfish, fish, strawberries, banana, and avocado.  Other allergic rhinitis The patient's history suggests seasonal allergic rhinoconjunctivitis.  We will evaluate further with aeroallergen skin testing in the near future when she has been off of antihistamines for at least 3 days.     Diagnostics: We were unable to perform skin tests today due to recent  administration of antihistamine.      Physical examination: Blood pressure 120/80, pulse 85, resp. rate 17, height 4\' 11"  (1.499 m), weight 122 lb (55.3 kg), SpO2 96 %.  General: Alert, interactive, in no acute distress. HEENT: TMs pearly gray, turbinates moderately edematous without discharge, post-pharynx mildly erythematous. Neck: Supple without lymphadenopathy. Lungs: Clear to auscultation without wheezing, rhonchi or rales. CV: Normal S1, S2 without murmurs. Skin: Warm and dry, without lesions or rashes.  The following portions of the patient's history were reviewed and updated as appropriate: allergies, current medications, past family history, past medical history, past social history, past surgical history and problem list.  Allergies as of 04/30/2017   No Known Allergies     Medication List       Accurate as of 04/30/17  1:42 PM. Always use your most recent med list.          albuterol 108 (90 Base) MCG/ACT inhaler Commonly known as:  PROVENTIL HFA;VENTOLIN HFA Inhale 1-2 puffs into the lungs every 6 (six) hours as needed for wheezing or shortness of breath.   fluticasone 50 MCG/ACT nasal spray Commonly known as:  FLONASE Place 1 spray into both nostrils 2 (two) times daily.   ibuprofen 600 MG tablet Commonly known as:  ADVIL,MOTRIN Take 600 mg by mouth every 6 (six) hours as needed.   multivitamin with minerals tablet Take 1 tablet by mouth daily.   ZYRTEC PO Take by mouth.       No Known Allergies  I appreciate the opportunity to take part in Debra Martin's care. Please do not hesitate to contact me with questions.  Sincerely,   R. Edgar Frisk, MD

## 2017-04-30 NOTE — Assessment & Plan Note (Signed)
The patient's history suggests seasonal allergic rhinoconjunctivitis.  We will evaluate further with aeroallergen skin testing in the near future when she has been off of antihistamines for at least 3 days.

## 2017-04-30 NOTE — Assessment & Plan Note (Signed)
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 We were unable to perform skin tests today due to recent administration of antihistamine.   Debra Martin will return in the near future for skin testing after having been off of antihistamines for at least 3 days.  Recommendations will be made based on those results.  For now, carefully avoid shellfish, fish, strawberries, banana, and avocado.

## 2017-04-30 NOTE — Patient Instructions (Signed)
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 We were unable to perform skin tests today due to recent administration of antihistamine.   Debra Martin will return in the near future for skin testing after having been off of antihistamines for at least 3 days.  Recommendations will be made based on those results.  For now, carefully avoid shellfish, fish, strawberries, banana, and avocado.  Other allergic rhinitis The patient's history suggests seasonal allergic rhinoconjunctivitis.  We will evaluate further with aeroallergen skin testing in the near future when she has been off of antihistamines for at least 3 days.     Return in about 1 week (around 05/07/2017) for allergy skin testing after having been off of antihistamines for 3 days.

## 2017-05-07 ENCOUNTER — Ambulatory Visit (INDEPENDENT_AMBULATORY_CARE_PROVIDER_SITE_OTHER): Payer: 59 | Admitting: Allergy and Immunology

## 2017-05-07 ENCOUNTER — Encounter: Payer: Self-pay | Admitting: Allergy and Immunology

## 2017-05-07 VITALS — BP 110/66 | HR 68 | Resp 16

## 2017-05-07 DIAGNOSIS — J3089 Other allergic rhinitis: Secondary | ICD-10-CM | POA: Diagnosis not present

## 2017-05-07 DIAGNOSIS — H1013 Acute atopic conjunctivitis, bilateral: Secondary | ICD-10-CM

## 2017-05-07 DIAGNOSIS — R1011 Right upper quadrant pain: Secondary | ICD-10-CM | POA: Diagnosis not present

## 2017-05-07 DIAGNOSIS — Z01419 Encounter for gynecological examination (general) (routine) without abnormal findings: Secondary | ICD-10-CM | POA: Diagnosis not present

## 2017-05-07 DIAGNOSIS — Z91018 Allergy to other foods: Secondary | ICD-10-CM

## 2017-05-07 DIAGNOSIS — T7800XD Anaphylactic reaction due to unspecified food, subsequent encounter: Secondary | ICD-10-CM

## 2017-05-07 MED ORDER — EPINEPHRINE 0.3 MG/0.3ML IJ SOAJ: INTRAMUSCULAR | 1 refills | Status: DC

## 2017-05-07 MED ORDER — LEVOCETIRIZINE DIHYDROCHLORIDE 5 MG PO TABS: 5.0000 mg | ORAL_TABLET | Freq: Every evening | ORAL | 5 refills | Status: DC

## 2017-05-07 MED ORDER — OLOPATADINE HCL 0.2 % OP SOLN: 1.0000 [drp] | Freq: Every day | OPHTHALMIC | 5 refills | Status: DC

## 2017-05-07 MED FILL — EPINEPHRINE 0.3 MG AUTO-INJ: 0.3 | 2 days supply | Qty: 2 | Fill #0

## 2017-05-07 MED FILL — OLOPATADINE HCL 0.2 % SOLN: 0.2 | 30 days supply | Qty: 3 | Fill #0

## 2017-05-07 MED FILL — LEVOCETIRIZINE 5 MG TABLET: 5 | 30 days supply | Qty: 30 | Fill #0

## 2017-05-07 NOTE — Assessment & Plan Note (Addendum)
   Aeroallergen avoidance measures have been discussed and provided in written form.  A prescription has been provided for levocetirizine, 5mg  daily as needed.  For now, continue fluticasone, one to 2 sprays per nostril daily as needed.  I have also recommended nasal saline spray (i.e., Simply Saline) or nasal saline lavage (i.e., NeilMed) as needed and prior to medicated nasal sprays. The risks and benefits of aeroallergen immunotherapy have been discussed. The patient is motivated to initiate immunotherapy if insurance coverage is favorable. She will let us know how she would like to proceed.

## 2017-05-07 NOTE — Patient Instructions (Addendum)
Food allergy The patient's history suggests food allergy and positive skin test results today confirm this diagnosis.  Meticulous avoidance of shellfish as discussed.  A prescription has been provided for epinephrine auto-injector 2 pack along with instructions for proper administration.  A food allergy action plan has been provided and discussed.  Medic Alert identification is recommended.  Oral allergy syndrome Oral allergy syndrome.  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 Peeling or cooking the food has shown to reduce symptoms and antihistamines may also relieve symptoms. Immunotherapy to the cross reacting pollens has improved or cured OAS in many patients, though this has not been consistent for all patients. Typically OAS is limited to itching or swelling of mucosal tissues from the lips to the back of the throat.   Information about OAS has been discussed and provided in written form.  All foods causing symptoms are to be avoided.  Should symptoms progress beyond the mouth and throat, 911 is to be called immediately.  Seasonal and perennial allergic rhinitis  Aeroallergen avoidance measures have been discussed and provided in written form.  A prescription has been provided for levocetirizine, 5mg  daily as needed.  For now, continue fluticasone, one to 2 sprays per nostril daily as needed.  I have also recommended nasal saline spray (i.e., Simply Saline) or nasal saline lavage (i.e., NeilMed) as needed and prior to medicated nasal sprays. The risks and benefits of aeroallergen immunotherapy have been discussed. The patient is motivated to initiate immunotherapy if insurance coverage is favorable. She will let us know how she would like to proceed.  Allergic conjunctivitis  Treatment plan as outlined above for allergic rhinitis.  A prescription has been provided for Pataday, one drop per eye daily as needed.  I have  also recommended eye lubricant drops (i.e., Natural Tears) as needed.   Return in about 6 months (around 11/04/2017), or if symptoms worsen or fail to improve.  Reducing Pollen Exposure  The American Academy of Allergy, Asthma and Immunology suggests the following steps to reduce your exposure to pollen during allergy seasons.    1. Do not hang sheets or clothing out to dry; pollen may collect on these items. 2. Do not mow lawns or spend time around freshly cut grass; mowing stirs up pollen. 3. Keep windows closed at night.  Keep car windows closed while driving. 4. Minimize morning activities outdoors, a time when pollen counts are usually at their highest. 5. Stay indoors as much as possible when pollen counts or humidity is high and on windy days when pollen tends to remain in the air longer. 6. Use air conditioning when possible.  Many air conditioners have filters that trap the pollen spores. 7. Use a HEPA room air filter to remove pollen form the indoor air you breathe.   Control of House Dust Mite Allergen  House dust mites play a major role in allergic asthma and rhinitis.  They occur in environments with high humidity wherever human skin, the food for dust mites is found. High levels have been detected in dust obtained from mattresses, pillows, carpets, upholstered furniture, bed covers, clothes and soft toys.  The principal allergen of the house dust mite is found in its feces.  A gram of dust may contain 1,000 mites and 250,000 fecal particles.  Mite antigen is easily measured in the air during house cleaning activities.    1. Encase mattresses, including the box spring, and pillow, in an air  tight cover.  Seal the zipper end of the encased mattresses with wide adhesive tape. 2. Wash the bedding in water of 130 degrees Farenheit weekly.  Avoid cotton comforters/quilts and flannel bedding: the most ideal bed covering is the dacron comforter. 3. Remove all upholstered furniture from  the bedroom. 4. Remove carpets, carpet padding, rugs, and non-washable window drapes from the bedroom.  Wash drapes weekly or use plastic window coverings. 5. Remove all non-washable stuffed toys from the bedroom.  Wash stuffed toys weekly. 6. Have the room cleaned frequently with a vacuum cleaner and a damp dust-mop.  The patient should not be in a room which is being cleaned and should wait 1 hour after cleaning before going into the room. 7. Close and seal all heating outlets in the bedroom.  Otherwise, the room will become filled with dust-laden air.  An electric heater can be used to heat the room. Reduce indoor humidity to less than 50%.  Do not use a humidifier.  Control of Dog or Cat Allergen  Avoidance is the best way to manage a dog or cat allergy. If you have a dog or cat and are allergic to dog or cats, consider removing the dog or cat from the home. If you have a dog or cat but don't want to find it a new home, or if your family wants a pet even though someone in the household is allergic, here are some strategies that may help keep symptoms at bay:  1. Keep the pet out of your bedroom and restrict it to only a few rooms. Be advised that keeping the dog or cat in only one room will not limit the allergens to that room. 2. Don't pet, hug or kiss the dog or cat; if you do, wash your hands with soap and water. 3. High-efficiency particulate air (HEPA) cleaners run continuously in a bedroom or living room can reduce allergen levels over time. 4. Place electrostatic material sheet in the air inlet vent in the bedroom. 5. Regular use of a high-efficiency vacuum cleaner or a central vacuum can reduce allergen levels. 6. Giving your dog or cat a bath at least once a week can reduce airborne allergen.  Control of Mold Allergen  Mold and fungi can grow on a variety of surfaces provided certain temperature and moisture conditions exist.  Outdoor molds grow on plants, decaying vegetation and  soil.  The major outdoor mold, Alternaria and Cladosporium, are found in very high numbers during hot and dry conditions.  Generally, a late Summer - Fall peak is seen for common outdoor fungal spores.  Rain will temporarily lower outdoor mold spore count, but counts rise rapidly when the rainy period ends.  The most important indoor molds are Aspergillus and Penicillium.  Dark, humid and poorly ventilated basements are ideal sites for mold growth.  The next most common sites of mold growth are the bathroom and the kitchen.  Outdoor [employer] 1. Use air conditioning and keep windows closed 2. Avoid exposure to decaying vegetation. 3. Avoid leaf raking. 4. Avoid grain handling. 5. Consider wearing a face mask if working in moldy areas.  Indoor Mold Control 1. Maintain humidity below 50%. 2. Clean washable surfaces with 5% bleach solution. 3. Remove sources e.g. Contaminated carpets.  Control of Cockroach Allergen  Cockroach allergen has been identified as an important cause of acute attacks of asthma, especially in urban settings.  There are fifty-five species of cockroach that exist in the [place], however  only three, the [place], [place] and [place] species produce allergen that can affect patients with Asthma.  Allergens can be obtained from fecal particles, egg casings and secretions from cockroaches.    1. Remove food sources. 2. Reduce access to water. 3. Seal access and entry points. 4. Spray runways with 0.5-1% Diazinon or Chlorpyrifos 5. Blow boric acid power under stoves and refrigerator. 6. Place bait stations (hydramethylnon) at feeding sites.   Oral Allergy Syndrome (OAS)  Oral Allergy Syndrome or OAS is an allergic reaction to certain (usually fresh) fruits, nuts, and vegetables. The allergy is not actually an allergy to food but a syndrome that develops in pollen allergy sufferers. The immune system mistakes the food proteins for the pollen proteins and causes an  allergic reaction. For instance, an allergy to ragweed is associated with OAS reactions to banana, watermelon, cantaloupe, honeydew, zucchini, and cucumber. This does not mean that all sufferers of an allergy to ragweed will experience adverse effects from all or even any of these foods. Reactions may begin with one type of food and with reactions to others developing later. However, reaction to one or more foods in any given category does not necessarily mean a person is allergic to all foods in that group. OAS sufferers may have a number of reactions that usually occur very rapidly, within minutes of eating a trigger food. The most common reaction is an itching or burning sensation in the lips, mouth, and/or pharynx. Sometimes other reactions can be triggered in the eyes, nose, and skin. The most severe reactions can result in asthma problems or anaphylaxis.  If a sufferer is able to swallow the food, there is a good chance that there will be a reaction later in the gastrointestinal tract. Vomiting, diarrhea, severe indigestion, or cramps may occur.  Treatment: An OAS sufferer should avoid foods to which they are allergic. Peeling or cooking the food has shown to reduce symptoms in the throat and mouth, but may not relieve symptoms in the gastrointestinal tract. Antihistamines may also relieve the symptoms of the allergy. Persons with severe reactions may consider carrying injectable epinephrine should systemic symptoms occur. Allergy immunotherapy to the pollens has improved or cured OAS in many patients, though this has not been consistent for all patients. [place] Housekeeper pollen: almonds, apples, celery, cherries, hazel nuts, peaches, pears, parsley, strawberry, raspberry . Birch pollen: almonds, apples, apricots, avocados, bananas, carrots, celery, cherries, chicory, coriander, fennel, fig, hazel nuts, kiwifruit, nectarines, parsley, parsnips, peaches, pears, peppers, plums, potatoes, prunes, soy, strawberries,  wheat; Potential: walnuts . Grass pollen: fig, melons, tomatoes, oranges . Mugwort pollen : carrots, celery, coriander, fennel, parsley, peppers, sunflower . Ragweed pollen : banana, cantaloupe, cucumber, green pepper, paprika, sunflower seeds/oil, honeydew, watermelon, zucchini, echinacea, artichoke, dandelions, honey (if bees pollinate from wild flowers), hibiscus or chamomile tea . Possible cross-reactions (to any of the above): berries (strawberries, blueberries, raspberries, etc), citrus (oranges, lemons, etc), grapes, mango, figs, peanut, pineapple, pomegranates, watermelon

## 2017-05-07 NOTE — Progress Notes (Signed)
Follow-up Note  RE: Debra Martin MRN: [id number] DOB: 1971/07/23 Date of Office Visit: 05/07/2017  Primary care provider: Kathyrn Lass, MD Referring provider: Kathyrn Lass, MD  History of present illness: Debra Martin is a 46 y.o. female with rhinoconjunctivitis and possible food allergy presents today for allergy skin testing.  She was last seen in this clinic on 04/30/2017 but was unable to be skin tested at that time due to recent administration of antihistamine. As you recall, approximately 7 weeks ago she consumed crab and within an hour developed hives on her upper extremities and abdomen.  She did not experience concomitant angioedema, cardiopulmonary symptoms, or GI symptoms.  Approximately 3 weeks ago she consumed shrimp and fish and within an hour developed eyelid angioedema.  She took diphenhydramine as soon as she noticed symptoms and the symptoms resolved without further medical intervention.  She also states that when she consumes strawberries, bananas, or avocado she experiences oral pruritus.  She experiences nasal congestion, rhinorrhea, sneezing, nasal pruritus, and ocular pruritus.  These nasal and ocular symptoms are most frequent and severe with pollen exposure.   Assessment and plan: Food allergy The patient's history suggests food allergy and positive skin test results today confirm this diagnosis.  Meticulous avoidance of shellfish as discussed.  A prescription has been provided for epinephrine auto-injector 2 pack along with instructions for proper administration.  A food allergy action plan has been provided and discussed.  Medic Alert identification is recommended.  Oral allergy syndrome Oral allergy syndrome.  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 Peeling or cooking the food has shown to reduce symptoms and antihistamines may also relieve symptoms. Immunotherapy to the cross reacting pollens has  improved or cured OAS in many patients, though this has not been consistent for all patients. Typically OAS is limited to itching or swelling of mucosal tissues from the lips to the back of the throat.   Information about OAS has been discussed and provided in written form.  All foods causing symptoms are to be avoided.  Should symptoms progress beyond the mouth and throat, 911 is to be called immediately.  Seasonal and perennial allergic rhinitis  Aeroallergen avoidance measures have been discussed and provided in written form.  A prescription has been provided for levocetirizine, 5mg  daily as needed.  For now, continue fluticasone, one to 2 sprays per nostril daily as needed.  I have also recommended nasal saline spray (i.e., Simply Saline) or nasal saline lavage (i.e., NeilMed) as needed and prior to medicated nasal sprays. The risks and benefits of aeroallergen immunotherapy have been discussed. The patient is motivated to initiate immunotherapy if insurance coverage is favorable. She will let us know how she would like to proceed.  Allergic conjunctivitis  Treatment plan as outlined above for allergic rhinitis.  A prescription has been provided for Pataday, one drop per eye daily as needed.  I have also recommended eye lubricant drops (i.e., Natural Tears) as needed.   Meds ordered this encounter  Medications  . EPINEPHrine 0.3 mg/0.3 mL IJ SOAJ injection    Sig: Use as directed for severe allergic reaction    Dispense:  2 Device    Refill:  1  . levocetirizine (XYZAL) 5 MG tablet    Sig: Take 1 tablet (5 mg total) by mouth every evening.    Dispense:  30 tablet    Refill:  5  . Olopatadine HCl 0.2 % SOLN  Sig: Apply 1 drop to eye daily.    Dispense:  2.5 mL    Refill:  5    Diagnostics: Food allergen skin testing: Positive to shellfish mix, shrimp, crab, and lobster. Environmental allergen skin testing: Positive to grass pollen, weed pollen, ragweed pollen, tree  pollen, molds, cockroach antigen, and dust mite antigen.    Physical examination: Blood pressure 110/66, pulse 68, resp. rate 16.  General: Alert, interactive, in no acute distress. HEENT: TMs pearly gray, turbinates moderately edematous with thick discharge, post-pharynx mildly erythematous. Neck: Supple without lymphadenopathy. Lungs: Clear to auscultation without wheezing, rhonchi or rales. CV: Normal S1, S2 without murmurs. Skin: Warm and dry, without lesions or rashes.  The following portions of the patient's history were reviewed and updated as appropriate: allergies, current medications, past family history, past medical history, past social history, past surgical history and problem list.  Allergies as of 05/07/2017   No Known Allergies     Medication List       Accurate as of 05/07/17  6:14 PM. Always use your most recent med list.          EPINEPHrine 0.3 mg/0.3 mL Soaj injection Commonly known as:  EPI-PEN Use as directed for severe allergic reaction   fluticasone 50 MCG/ACT nasal spray Commonly known as:  FLONASE Place 1 spray into both nostrils 2 (two) times daily.   ibuprofen 600 MG tablet Commonly known as:  ADVIL,MOTRIN Take 600 mg by mouth every 6 (six) hours as needed.   levocetirizine 5 MG tablet Commonly known as:  XYZAL Take 1 tablet (5 mg total) by mouth every evening.   multivitamin with minerals tablet Take 1 tablet by mouth daily.   Olopatadine HCl 0.2 % Soln Apply 1 drop to eye daily.   ZYRTEC PO Take by mouth.            Discharge Care Instructions        Start     Ordered   05/07/17 0000  Allergy Test    Question:  Allergy test to perform  Answer:  1-59; selected foods 23   05/07/17 1728   05/07/17 0000  Interdermal Allergy Test    Question Answer Comment  Allergens Control   Allergens [place]   Allergens Johnson   Allergens 7 Grass   Allergens Ragweed Mix   Allergens Weed Mix   Allergens Mold 1   Allergens Mold 2     Allergens Mold 3   Allergens Mold 4   Allergens Cat   Allergens Dog      05/07/17 1728   05/07/17 0000  EPINEPHrine 0.3 mg/0.3 mL IJ SOAJ injection     05/07/17 1728   05/07/17 0000  levocetirizine (XYZAL) 5 MG tablet  Every evening     05/07/17 1728   05/07/17 0000  Olopatadine HCl 0.2 % SOLN  Daily     05/07/17 1728      No Known Allergies  Review of systems: Review of systems negative except as noted in HPI / PMHx or noted below: Constitutional: Negative.  HENT: Negative.   Eyes: Negative.  Respiratory: Negative.   Cardiovascular: Negative.  Gastrointestinal: Negative.  Genitourinary: Negative.  Musculoskeletal: Negative.  Neurological: Negative.  Endo/Heme/Allergies: Negative.  Cutaneous: Negative.  Past Medical History:  Diagnosis Date  . AMA (advanced maternal age) multigravida 46+   . Anemia   . Bulging disc   . Constipation, chronic   . H/O migraine   . H/O varicella   . Short  stature     Family History  Problem Relation Age of Onset  . Asthma Mother   . Heart disease Brother        Stenosis of valves  . Stroke Paternal Uncle   . Hypertension Paternal Uncle     Social History   Social History  . Marital status: Married    Spouse name: N/A  . Number of children: N/A  . Years of education: N/A   Occupational History  . Not on file.   Social History Main Topics  . Smoking status: Never Smoker  . Smokeless tobacco: Never Used  . Alcohol use No  . Drug use: No  . Sexual activity: Yes    Birth control/ protection: IUD     Comment: Paragard inserted 11/2010   Other Topics Concern  . Not on file   Social History Narrative  . No narrative on file     I appreciate the opportunity to take part in Luva's care. Please do not hesitate to contact me with questions.  Sincerely,   R. Edgar Frisk, MD

## 2017-05-07 NOTE — Assessment & Plan Note (Signed)
The patient's history suggests food allergy and positive skin test results today confirm this diagnosis.  Meticulous avoidance of shellfish as discussed.  A prescription has been provided for epinephrine auto-injector 2 pack along with instructions for proper administration.  A food allergy action plan has been provided and discussed.  Medic Alert identification is recommended.

## 2017-05-07 NOTE — Assessment & Plan Note (Signed)
   Treatment plan as outlined above for allergic rhinitis.  A prescription has been provided for Pataday, one drop per eye daily as needed.  I have also recommended eye lubricant drops (i.e., Natural Tears) as needed. 

## 2017-05-07 NOTE — Assessment & Plan Note (Signed)
Oral allergy syndrome.  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 Peeling or cooking the food has shown to reduce symptoms and antihistamines may also relieve symptoms. Immunotherapy to the cross reacting pollens has improved or cured OAS in many patients, though this has not been consistent for all patients. Typically OAS is limited to itching or swelling of mucosal tissues from the lips to the back of the throat.   Information about OAS has been discussed and provided in written form.  All foods causing symptoms are to be avoided.  Should symptoms progress beyond the mouth and throat, 911 is to be called immediately. 

## 2017-05-13 ENCOUNTER — Ambulatory Visit
Admission: RE | Admit: 2017-05-13 | Discharge: 2017-05-13 | Disposition: A | Discharge status: Home / Self Care | Payer: 59 | Source: Ambulatory Visit | Attending: Family Medicine | Admitting: Family Medicine

## 2017-05-13 DIAGNOSIS — Z1231 Encounter for screening mammogram for malignant neoplasm of breast: Secondary | ICD-10-CM | POA: Diagnosis not present

## 2017-05-28 ENCOUNTER — Telehealth: Payer: Self-pay | Admitting: Allergy and Immunology

## 2017-05-28 NOTE — Telephone Encounter (Signed)
Please call patient back regarding her balance due. She is not understanding the charges applied. Thanks

## 2017-05-29 NOTE — Telephone Encounter (Signed)
Pt was concerned because we charged an ov when she came back for testing just a few days later.  I spoke with Dr. Verlin Fester and he agreed that we should adjust the OV on 05-07-17  - left message with patient that we adjusted the ov and sent a corrected claim to her ins - kt

## 2017-06-04 DIAGNOSIS — Z0000 Encounter for general adult medical examination without abnormal findings: Secondary | ICD-10-CM | POA: Diagnosis not present

## 2017-06-04 DIAGNOSIS — R739 Hyperglycemia, unspecified: Secondary | ICD-10-CM | POA: Diagnosis not present

## 2017-06-04 DIAGNOSIS — O99810 Abnormal glucose complicating pregnancy: Secondary | ICD-10-CM | POA: Diagnosis not present

## 2017-06-04 DIAGNOSIS — J309 Allergic rhinitis, unspecified: Secondary | ICD-10-CM | POA: Diagnosis not present

## 2017-06-04 DIAGNOSIS — R1011 Right upper quadrant pain: Secondary | ICD-10-CM | POA: Diagnosis not present

## 2017-06-04 MED FILL — IBUPROFEN 600 MG TABLET: 600 | 10 days supply | Qty: 30 | Fill #0

## 2017-06-05 ENCOUNTER — Other Ambulatory Visit (HOSPITAL_COMMUNITY): Payer: Self-pay | Admitting: Family Medicine

## 2017-06-05 DIAGNOSIS — R1011 Right upper quadrant pain: Secondary | ICD-10-CM

## 2017-06-11 ENCOUNTER — Ambulatory Visit (HOSPITAL_BASED_OUTPATIENT_CLINIC_OR_DEPARTMENT_OTHER)
Admission: RE | Admit: 2017-06-11 | Discharge: 2017-06-11 | Disposition: A | Discharge status: Home / Self Care | Payer: 59 | Source: Ambulatory Visit | Attending: Family Medicine | Admitting: Family Medicine

## 2017-06-11 DIAGNOSIS — R1011 Right upper quadrant pain: Secondary | ICD-10-CM | POA: Diagnosis not present

## 2017-06-11 MED FILL — OLOPATADINE HCL 0.2 % SOLN: 0.2 | 30 days supply | Qty: 3 | Fill #1

## 2017-06-11 MED FILL — LEVOCETIRIZINE 5 MG TABLET: 5 | 30 days supply | Qty: 30 | Fill #1

## 2017-06-25 ENCOUNTER — Telehealth: Payer: Self-pay | Admitting: Allergy and Immunology

## 2017-06-25 DIAGNOSIS — H524 Presbyopia: Secondary | ICD-10-CM | POA: Diagnosis not present

## 2017-06-25 NOTE — Telephone Encounter (Signed)
Patient called and would like to know her allergy shot costs and how they are billed to her insurance company

## 2017-06-25 NOTE — Telephone Encounter (Signed)
Gave pt prices for injections & vials - she will decide if she wants to start injs - kt

## 2017-06-25 NOTE — Telephone Encounter (Signed)
Left message to call me back - kt °

## 2017-07-15 MED FILL — LEVOCETIRIZINE 5 MG TABLET: 5 | 90 days supply | Qty: 90 | Fill #2

## 2017-10-08 ENCOUNTER — Other Ambulatory Visit: Payer: Self-pay

## 2017-10-08 DIAGNOSIS — H1013 Acute atopic conjunctivitis, bilateral: Secondary | ICD-10-CM

## 2017-10-08 DIAGNOSIS — J3089 Other allergic rhinitis: Secondary | ICD-10-CM

## 2017-10-08 MED ORDER — LEVOCETIRIZINE DIHYDROCHLORIDE 5 MG PO TABS: 5.0000 mg | ORAL_TABLET | Freq: Every evening | ORAL | 0 refills | Status: DC

## 2017-10-08 MED FILL — LEVOCETIRIZINE 5 MG TABLET: 5 | 90 days supply | Qty: 90 | Fill #0

## 2017-12-31 ENCOUNTER — Other Ambulatory Visit: Payer: Self-pay | Admitting: Allergy and Immunology

## 2017-12-31 DIAGNOSIS — H1013 Acute atopic conjunctivitis, bilateral: Secondary | ICD-10-CM

## 2017-12-31 DIAGNOSIS — J3089 Other allergic rhinitis: Secondary | ICD-10-CM

## 2017-12-31 MED FILL — LEVOCETIRIZINE 5 MG TABLET: 5 | 90 days supply | Qty: 90 | Fill #0

## 2018-01-07 ENCOUNTER — Other Ambulatory Visit: Payer: Self-pay | Admitting: Family Medicine

## 2018-01-07 DIAGNOSIS — Z1231 Encounter for screening mammogram for malignant neoplasm of breast: Secondary | ICD-10-CM

## 2018-04-29 ENCOUNTER — Other Ambulatory Visit: Payer: Self-pay | Admitting: Allergy and Immunology

## 2018-04-29 DIAGNOSIS — H1013 Acute atopic conjunctivitis, bilateral: Secondary | ICD-10-CM

## 2018-04-29 DIAGNOSIS — J3089 Other allergic rhinitis: Secondary | ICD-10-CM

## 2018-04-29 MED FILL — LEVOCETIRIZINE 5 MG TABLET: 5 | 30 days supply | Qty: 30 | Fill #0

## 2018-04-30 MED FILL — AMOXICILLIN 500 MG CAPSULE: 500 | 7 days supply | Qty: 21 | Fill #0

## 2018-04-30 MED FILL — IBUPROFEN 800 MG TAB: 800 | 10 days supply | Qty: 30 | Fill #0

## 2018-05-13 DIAGNOSIS — Z01419 Encounter for gynecological examination (general) (routine) without abnormal findings: Secondary | ICD-10-CM | POA: Diagnosis not present

## 2018-05-13 DIAGNOSIS — Z6824 Body mass index (BMI) 24.0-24.9, adult: Secondary | ICD-10-CM | POA: Diagnosis not present

## 2018-05-20 ENCOUNTER — Ambulatory Visit
Admission: RE | Admit: 2018-05-20 | Discharge: 2018-05-20 | Disposition: A | Discharge status: Home / Self Care | Payer: 59 | Source: Ambulatory Visit | Attending: Family Medicine | Admitting: Family Medicine

## 2018-05-20 DIAGNOSIS — Z1231 Encounter for screening mammogram for malignant neoplasm of breast: Secondary | ICD-10-CM | POA: Diagnosis not present

## 2018-06-04 ENCOUNTER — Other Ambulatory Visit: Payer: Self-pay | Admitting: Allergy and Immunology

## 2018-06-04 DIAGNOSIS — H1013 Acute atopic conjunctivitis, bilateral: Secondary | ICD-10-CM

## 2018-06-04 DIAGNOSIS — J3089 Other allergic rhinitis: Secondary | ICD-10-CM

## 2018-06-10 ENCOUNTER — Other Ambulatory Visit: Payer: Self-pay | Admitting: Allergy and Immunology

## 2018-06-10 DIAGNOSIS — J3089 Other allergic rhinitis: Secondary | ICD-10-CM

## 2018-06-10 DIAGNOSIS — H1013 Acute atopic conjunctivitis, bilateral: Secondary | ICD-10-CM

## 2018-07-08 DIAGNOSIS — R7303 Prediabetes: Secondary | ICD-10-CM | POA: Diagnosis not present

## 2018-07-08 DIAGNOSIS — H9311 Tinnitus, right ear: Secondary | ICD-10-CM | POA: Diagnosis not present

## 2018-07-08 DIAGNOSIS — J309 Allergic rhinitis, unspecified: Secondary | ICD-10-CM | POA: Diagnosis not present

## 2018-07-08 DIAGNOSIS — M5136 Other intervertebral disc degeneration, lumbar region: Secondary | ICD-10-CM | POA: Diagnosis not present

## 2018-07-08 DIAGNOSIS — Z0000 Encounter for general adult medical examination without abnormal findings: Secondary | ICD-10-CM | POA: Diagnosis not present

## 2018-07-08 DIAGNOSIS — Z6824 Body mass index (BMI) 24.0-24.9, adult: Secondary | ICD-10-CM | POA: Diagnosis not present

## 2018-07-08 DIAGNOSIS — O99810 Abnormal glucose complicating pregnancy: Secondary | ICD-10-CM | POA: Diagnosis not present

## 2018-07-08 DIAGNOSIS — Z13220 Encounter for screening for lipoid disorders: Secondary | ICD-10-CM | POA: Diagnosis not present

## 2018-07-08 MED FILL — LEVOCETIRIZINE 5 MG TABLET: 5 | 90 days supply | Qty: 90 | Fill #0

## 2018-07-08 MED FILL — EPINEPHRINE 0.3 MG AUTO-INJ: 0.3 | 2 days supply | Qty: 2 | Fill #0

## 2018-09-26 MED FILL — LEVOCETIRIZINE 5 MG TABLET: 5 | 90 days supply | Qty: 90 | Fill #1

## 2018-11-20 MED FILL — LEVOCETIRIZINE 5 MG TABLET: 5 | 90 days supply | Qty: 90 | Fill #2

## 2019-02-10 ENCOUNTER — Other Ambulatory Visit: Payer: Self-pay | Admitting: Family Medicine

## 2019-02-10 DIAGNOSIS — Z1231 Encounter for screening mammogram for malignant neoplasm of breast: Secondary | ICD-10-CM

## 2019-03-16 MED FILL — LEVOCETIRIZINE 5 MG TABLET: 5 | 90 days supply | Qty: 90 | Fill #3

## 2019-03-24 MED FILL — LEVOCETIRIZINE 5 MG TABLET: 5 | 90 days supply | Qty: 90 | Fill #3

## 2019-04-28 MED FILL — DENTA 5000 PLUS CREAM: 1.1 | 30 days supply | Qty: 51 | Fill #0

## 2019-05-26 ENCOUNTER — Ambulatory Visit
Admission: RE | Admit: 2019-05-26 | Discharge: 2019-05-26 | Disposition: A | Discharge status: Home / Self Care | Payer: 59 | Source: Ambulatory Visit | Attending: Family Medicine | Admitting: Family Medicine

## 2019-05-26 ENCOUNTER — Other Ambulatory Visit: Payer: Self-pay

## 2019-05-26 DIAGNOSIS — Z1231 Encounter for screening mammogram for malignant neoplasm of breast: Secondary | ICD-10-CM | POA: Diagnosis not present

## 2019-05-27 ENCOUNTER — Other Ambulatory Visit: Payer: Self-pay | Admitting: Family Medicine

## 2019-05-27 DIAGNOSIS — R928 Other abnormal and inconclusive findings on diagnostic imaging of breast: Secondary | ICD-10-CM

## 2019-06-02 ENCOUNTER — Ambulatory Visit
Admission: RE | Admit: 2019-06-02 | Discharge: 2019-06-02 | Disposition: A | Discharge status: Home / Self Care | Payer: 59 | Source: Ambulatory Visit | Attending: Family Medicine | Admitting: Family Medicine

## 2019-06-02 ENCOUNTER — Other Ambulatory Visit: Payer: Self-pay

## 2019-06-02 ENCOUNTER — Other Ambulatory Visit: Payer: Self-pay | Admitting: Family Medicine

## 2019-06-02 DIAGNOSIS — R921 Mammographic calcification found on diagnostic imaging of breast: Secondary | ICD-10-CM

## 2019-06-02 DIAGNOSIS — R928 Other abnormal and inconclusive findings on diagnostic imaging of breast: Secondary | ICD-10-CM

## 2019-06-29 MED FILL — LEVOCETIRIZINE 5 MG TABLET: 5 | 90 days supply | Qty: 90 | Fill #0

## 2019-08-11 DIAGNOSIS — J309 Allergic rhinitis, unspecified: Secondary | ICD-10-CM | POA: Diagnosis not present

## 2019-08-11 DIAGNOSIS — Z6825 Body mass index (BMI) 25.0-25.9, adult: Secondary | ICD-10-CM | POA: Diagnosis not present

## 2019-08-11 DIAGNOSIS — Z13220 Encounter for screening for lipoid disorders: Secondary | ICD-10-CM | POA: Diagnosis not present

## 2019-08-11 DIAGNOSIS — R7303 Prediabetes: Secondary | ICD-10-CM | POA: Diagnosis not present

## 2019-08-11 DIAGNOSIS — Z0000 Encounter for general adult medical examination without abnormal findings: Secondary | ICD-10-CM | POA: Diagnosis not present

## 2019-08-11 DIAGNOSIS — H9311 Tinnitus, right ear: Secondary | ICD-10-CM | POA: Diagnosis not present

## 2019-08-11 MED FILL — EPINEPHRINE 0.3 MG AUTO-INJ: 0.3 | 2 days supply | Qty: 2 | Fill #0

## 2019-09-23 MED FILL — EPINEPHRINE 0.3 MG AUTO-INJ: 0.3 | 2 days supply | Qty: 2 | Fill #0

## 2019-09-23 MED FILL — LEVOCETIRIZINE 5 MG TABLET: 5 | 90 days supply | Qty: 90 | Fill #0

## 2019-11-30 DIAGNOSIS — H5213 Myopia, bilateral: Secondary | ICD-10-CM | POA: Diagnosis not present

## 2019-11-30 DIAGNOSIS — H524 Presbyopia: Secondary | ICD-10-CM | POA: Diagnosis not present

## 2019-11-30 DIAGNOSIS — Z135 Encounter for screening for eye and ear disorders: Secondary | ICD-10-CM | POA: Diagnosis not present

## 2019-12-01 ENCOUNTER — Other Ambulatory Visit: Payer: Self-pay

## 2019-12-01 ENCOUNTER — Ambulatory Visit
Admission: RE | Admit: 2019-12-01 | Discharge: 2019-12-01 | Disposition: A | Discharge status: Home / Self Care | Payer: 59 | Source: Ambulatory Visit | Attending: Family Medicine | Admitting: Family Medicine

## 2019-12-01 ENCOUNTER — Other Ambulatory Visit: Payer: Self-pay | Admitting: Family Medicine

## 2019-12-01 DIAGNOSIS — R921 Mammographic calcification found on diagnostic imaging of breast: Secondary | ICD-10-CM

## 2019-12-21 MED FILL — LEVOCETIRIZINE 5 MG TABLET: 5 | 90 days supply | Qty: 90 | Fill #1

## 2020-01-14 DIAGNOSIS — Z01419 Encounter for gynecological examination (general) (routine) without abnormal findings: Secondary | ICD-10-CM | POA: Diagnosis not present

## 2020-01-14 DIAGNOSIS — Z124 Encounter for screening for malignant neoplasm of cervix: Secondary | ICD-10-CM | POA: Diagnosis not present

## 2020-03-15 MED FILL — LEVOCETIRIZINE 5 MG TABLET: 5 | 90 days supply | Qty: 90 | Fill #2

## 2020-06-07 ENCOUNTER — Other Ambulatory Visit: Payer: Self-pay | Admitting: Obstetrics and Gynecology

## 2020-06-07 ENCOUNTER — Ambulatory Visit
Admission: RE | Admit: 2020-06-07 | Discharge: 2020-06-07 | Disposition: A | Discharge status: Home / Self Care | Payer: 59 | Source: Ambulatory Visit | Attending: Family Medicine | Admitting: Family Medicine

## 2020-06-07 ENCOUNTER — Other Ambulatory Visit: Payer: Self-pay

## 2020-06-07 DIAGNOSIS — R921 Mammographic calcification found on diagnostic imaging of breast: Secondary | ICD-10-CM | POA: Diagnosis not present

## 2020-06-14 ENCOUNTER — Other Ambulatory Visit (HOSPITAL_BASED_OUTPATIENT_CLINIC_OR_DEPARTMENT_OTHER): Payer: Self-pay | Admitting: Family Medicine

## 2020-06-14 MED FILL — LEVOCETIRIZINE 5 MG TABLET: 5 | 90 days supply | Qty: 90 | Fill #3

## 2020-06-14 MED FILL — EPINEPHRINE 0.3 MG AUTO-INJ: 0.3 | 2 days supply | Qty: 2 | Fill #0

## 2020-07-05 ENCOUNTER — Other Ambulatory Visit (HOSPITAL_BASED_OUTPATIENT_CLINIC_OR_DEPARTMENT_OTHER): Payer: Self-pay | Admitting: Occupational Medicine

## 2020-07-06 MED FILL — CYCLOBENZAPRINE HCL 10 MG T: 10 | 10 days supply | Qty: 10 | Fill #0

## 2020-09-28 MED FILL — LEVOCETIRIZINE 5 MG TABLET: 5 | 90 days supply | Qty: 90 | Fill #0

## 2021-02-21 ENCOUNTER — Other Ambulatory Visit: Payer: Self-pay | Admitting: Family Medicine

## 2021-02-21 DIAGNOSIS — Z09 Encounter for follow-up examination after completed treatment for conditions other than malignant neoplasm: Secondary | ICD-10-CM

## 2021-06-07 ENCOUNTER — Other Ambulatory Visit: Payer: Self-pay | Admitting: Family Medicine

## 2021-06-07 DIAGNOSIS — R921 Mammographic calcification found on diagnostic imaging of breast: Secondary | ICD-10-CM

## 2021-06-12 ENCOUNTER — Other Ambulatory Visit: Payer: Self-pay

## 2021-06-12 ENCOUNTER — Ambulatory Visit
Admission: RE | Admit: 2021-06-12 | Discharge: 2021-06-12 | Disposition: A | Discharge status: Home / Self Care | Payer: No Typology Code available for payment source | Source: Ambulatory Visit | Attending: Family Medicine | Admitting: Family Medicine

## 2021-06-12 DIAGNOSIS — R921 Mammographic calcification found on diagnostic imaging of breast: Secondary | ICD-10-CM

## 2021-12-15 ENCOUNTER — Other Ambulatory Visit: Payer: Self-pay | Admitting: Family Medicine

## 2021-12-15 ENCOUNTER — Ambulatory Visit
Admission: RE | Admit: 2021-12-15 | Discharge: 2021-12-15 | Disposition: A | Discharge status: Home / Self Care | Payer: No Typology Code available for payment source | Source: Ambulatory Visit | Attending: Family Medicine | Admitting: Family Medicine

## 2021-12-15 DIAGNOSIS — R059 Cough, unspecified: Secondary | ICD-10-CM

## 2021-12-20 ENCOUNTER — Telehealth: Payer: Self-pay

## 2021-12-20 NOTE — Telephone Encounter (Signed)
Patient calling regarding herself as well as her daughter which I already sent message to you. Patient will be a return patient and she wants to do immunotherapy as well with her daughter, but because of her working 2 jobs it would be hard to get off weekly. 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 Mom aware she may not get a return call until Monday when you return back to the [place] office. Patient has an appointment with you end of May.  ?

## 2022-01-03 NOTE — Telephone Encounter (Signed)
Unfortunately due to risk we do not allow Patients to allergy injections at home.  The [place] office might be an option for her based on location.  I will be there on Fridays and they can follow up with me there if it is more convenient.  If this works, let me know and I will order her allergy injections.  ?  ?Also [facility] may be an option for her if she wants to consolidate the visit time and reduce the amount of weekly injections. I did leave mom a message regarding message written above from Dr. Edison Pace. ?

## 2022-01-04 NOTE — Telephone Encounter (Signed)
Pt would like a call back from [place]. ?

## 2022-01-31 ENCOUNTER — Ambulatory Visit (INDEPENDENT_AMBULATORY_CARE_PROVIDER_SITE_OTHER): Payer: No Typology Code available for payment source | Admitting: Internal Medicine

## 2022-01-31 VITALS — BP 94/62 | HR 80 | Temp 98.2°F | Resp 18 | Ht 59.0 in | Wt 118.0 lb

## 2022-01-31 DIAGNOSIS — H1010 Acute atopic conjunctivitis, unspecified eye: Secondary | ICD-10-CM

## 2022-01-31 DIAGNOSIS — J3089 Other allergic rhinitis: Secondary | ICD-10-CM | POA: Diagnosis not present

## 2022-01-31 DIAGNOSIS — H1013 Acute atopic conjunctivitis, bilateral: Secondary | ICD-10-CM | POA: Diagnosis not present

## 2022-01-31 DIAGNOSIS — T7800XA Anaphylactic reaction due to unspecified food, initial encounter: Secondary | ICD-10-CM

## 2022-01-31 DIAGNOSIS — J302 Other seasonal allergic rhinitis: Secondary | ICD-10-CM

## 2022-01-31 MED ORDER — EPINEPHRINE 0.3 MG/0.3ML IJ SOAJ: 0.3000 mg | INTRAMUSCULAR | 1 refills | Status: DC | PRN

## 2022-01-31 NOTE — Progress Notes (Unsigned)
New Patient Note  RE: Debra Martin MRN: [id number] DOB: 03-19-1971 Date of Office Visit: 01/31/2022  Consult requested by: Kathyrn Lass, MD Primary care provider: Kathyrn Lass, MD  Chief Complaint: Allergies  History of Present Illness: I had the pleasure of seeing Debra Martin for initial evaluation at the [facility] on 01/31/2022. She is a 51 y.o. female, who is referred here by Kathyrn Lass, MD for the evaluation of rhinitis, food allergy and drug allergy.  History obtained from patient.  Chronic rhinitis: started a few years ago, previously followed with Dr. Verlin Fester for shellfish allergy and allergic rhinitis Symptoms include:  Cough, diffuse pruritus, nasal congestion, post nasal drainage, sneezing, itchy eyes, and itchy nose  Occurs year-round Potential triggers: pollen  Treatments tried: Xyzal 5 mg daily-will control symptoms but when she forgets her medication she has breakthrough diffuse pruritus and nasal congestion Previous allergy testing: yes 2017: Skin prick positive to [place], walnut, dust mite, roach ; IDT positive to grass, ragweed, weed mix, mold 2, mold for History of reflux/heartburn:  Yes tried 14 days of omeprazole OTC for cough without good response History of chronic sinusitis or sinus surgery: no Nonallergic triggers:  Denies    Concern for Food Allergy:  Food of concern: shellfish tolerates lobster and shrimp, but has not tried crab History of reaction: 2017: consumed crab and within an hour developed hives on her upper extremities and abdomen.  Has recently tolerated shrimp and 1 bite of lobster without symptoms.  She is interested in reintroducing crab if possible Previous allergy testing  2018: positive to shellfish mix, shrimp, crab and lobster Eats egg, dairy, wheat, soy, fish, shrimp, peanuts, tree nuts, sesame without reactions Carries an epinephrine autoinjector: yes Has food allergy action plan no    Drug Allergy   Also reports rash with orange-colored coated ibuprofen, tolerates other forms  Assessment and Plan: Debra Martin is a 51 y.o. female with: Seasonal and perennial allergic rhinitis - Plan: Allergy Test  Allergy with anaphylaxis due to food - Plan: Allergy Test, Allergen, Crab, f23  Seasonal allergic conjunctivitis - Plan: Allergy Test Plan: Patient Instructions  Seasonal and perennial rhinitis: Not well controlled  - Testing today showed positive to grass, weeds, trees, dust mite, cat, roach, mouse, tobacco leaf - Copy of test results provided.  - Avoidance measures provided.  - Continue with: Xyzal (levocetirizine) '5mg'$  tablet once daily - Start taking: Flonase (fluticasone) two sprays per nostril daily - You can use an extra dose of the antihistamine, if needed, for breakthrough symptoms.  - Consider nasal saline rinses 1-2 times daily to remove allergens from the nasal cavities as well as help with mucous clearance (this is especially helpful to do before the nasal sprays are given) - Consider allergy shots as a means of long-term control and can reduce lifetime use of medications  - Allergy shots "re-train" and "reset" the immune system to ignore environmental allergens and decrease the resulting immune response to those allergens (sneezing, itchy watery eyes, runny nose, nasal congestion, etc).    - Allergy shots improve symptoms in 75-85%  - Allergy shots are the only potential permanent and disease modifying option  - We can discuss more at the next appointment if the medications are not working for you.  Start allergy injections. Had a detailed discussion with patient/family that clinical history is suggestive of allergic rhinitis, and may benefit from allergy immunotherapy (AIT). Discussed in detail regarding the dosing, schedule, side effects (mild to  moderate local allergic reaction and rarely systemic allergic reactions including anaphylaxis/death), alternatives and benefits  (significant improvement in nasal symptoms, seasonal flares of asthma) of immunotherapy with the patient. There is significant time commitment involved with allergy shots, which includes weekly immunotherapy injections for first 9-12 months and then biweekly to monthly injections for 3-5 years. Clinical response is often delayed and patient may not see an improvement for 6-12 months. Consent was signed. I have prescribed epinephrine injectable and demonstrated proper use. For mild symptoms you can take over the counter antihistamines such as Benadryl and monitor symptoms closely. If symptoms worsen or if you have severe symptoms including breathing issues, throat closure, significant swelling, whole body hives, severe diarrhea and vomiting, lightheadedness then inject epinephrine and seek immediate medical care afterwards. Action plan given.   Food allergy:  - today's skin testing was negative to shrimp positive to crab and lobster negative to oyster and scallop - please strictly avoid crab - okay to resume eating shrimp and lobster as you have already introduced this we will get the blood work to double check for crab and consider oral food challenge for crab introduction in the clinic - for SKIN only reaction, okay to take Benadryl 1 capsules every 4 hours - for SKIN + ANY additional symptoms, OR IF concern for LIFE THREATENING reaction = Epipen Autoinjector EpiPen 0.3 mg. - If using Epinephrine autoinjector, call 911 - A food allergy action plan has been provided and discussed. - Medic Alert identification is recommended.   Follow up: in 4 weeks for initial Ait injections  We will contact with you based on Martin for next step on shellfish allergy  Thank you so much for letting me partake in your care today.  Don't hesitate to reach out if you have any additional concerns!  Roney Marion, MD  [facility] [place] [place]  No follow-ups on file.  Meds ordered this encounter   Medications   EPINEPHrine 0.3 mg/0.3 mL IJ SOAJ injection    Sig: Inject 0.3 mg into the muscle as needed for anaphylaxis.    Dispense:  1 each    Refill:  1   Lab Orders         Allergen, Crab, f23      Other allergy screening: Asthma: no Rhino conjunctivitis: yes Food allergy: yes Medication allergy: yes Hymenoptera allergy: no Urticaria: yes Eczema:no History of recurrent infections suggestive of immunodeficency: no  Diagnostics: Skin Testing: Environmental allergy panel and select foods. Positive to grass, weeds, trees, dust mite, cat, roach, mouse, tobacco leaf Also positive to crab and lobster, negative shrimp oyster scallops Results interpreted by myself and discussed with patient/family.  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   Food allergy 04/30/2017   Seasonal and perennial allergic rhinitis 04/30/2017   Allergic conjunctivitis 04/30/2017   Chronic back pain 03/18/2012   Past Medical History:  Diagnosis Date  AMA (advanced maternal age) multigravida 35+    Anemia    Bulging disc    Constipation, chronic    H/O migraine    H/O varicella    Short stature    Past Surgical History: Past Surgical History:  Procedure Laterality Date   BREAST CYST EXCISION Right 1990   remove cyst from breast  1995   Medication List:  Current Outpatient Medications  Medication Sig Dispense Refill   EPINEPHrine 0.3 mg/0.3 mL IJ SOAJ injection Use as directed for severe allergic reaction 2 Device 1   EPINEPHrine 0.3 mg/0.3 mL IJ SOAJ injection Inject 0.3 mg into the muscle as needed for  anaphylaxis. 1 each 1   fluticasone (FLONASE) 50 MCG/ACT nasal spray Place 1 spray into both nostrils 2 (two) times daily.     ibuprofen (ADVIL,MOTRIN) 600 MG tablet Take 600 mg by mouth every 6 (six) hours as needed.     levocetirizine (XYZAL) 5 MG tablet TAKE 1 TABLET (5 MG TOTAL) BY MOUTH EVERY EVENING. 30 tablet 0   Multiple Vitamins-Minerals (MULTIVITAMIN WITH MINERALS) tablet Take 1 tablet by mouth daily.     Naphazoline-Pheniramine (OPCON-A) 0.027-0.315 % SOLN Apply to eye.     sodium chloride (OCEAN) 0.65 % nasal spray 2 sprays in each nostril as needed     No current facility-administered medications for this visit.   Allergies: Allergies  Allergen Reactions   Gold-Containing Drug Products Other (See Comments)   Ibuprofen Other (See Comments)   Shellfish-Derived Products Hives, Itching, Rash and Swelling   Social History: Social History   Socioeconomic History   Marital status: Married    Spouse name: Not on file   Number of children: Not on file   Years of education: Not on file   Highest education level: Not on file  Occupational History   Not on file  Tobacco Use   Smoking status: Never   Smokeless tobacco: Never  Vaping Use   Vaping Use: Never used  Substance and Sexual Activity   Alcohol use: No   Drug use: No   Sexual activity: Yes    Birth control/protection: I.U.D.    Comment: Paragard inserted 11/2010  Other Topics Concern   Not on file  Social History Narrative   Not on file   Social Determinants of Health   Financial Resource Strain: Not on file  Food Insecurity: Not on file  Transportation Needs: Not on file  Physical Activity: Not on file  Stress: Not on file  Social Connections: Not on file   Lives in a single-family home that is 51 years old, there are no roaches in the house and bed is 2 feet off the floor.  There are dust mite precautions on bed but not pillow.  There is a HEPA filter in the home and home is not near an interstate  industrial area.  She is not exposed to fumes, chemicals or dust in home or through hobbies. Smoking: Never smoker Occupation: Administrator, Civil Service HistoryFreight forwarder in the house: no Charity fundraiser in the family room: no Carpet in the bedroom: yes Heating: gas Cooling: central Pet: no  Family History: Family History  Problem Relation Age of Onset   Asthma Mother    Heart disease Brother        Stenosis of valves   Stroke Paternal Uncle    Hypertension Paternal Uncle    Breast cancer Neg Hx      ROS: All others negative except as noted per  HPI.   Objective: BP 94/62   Pulse 80   Temp 98.2 F (36.8 C) (Temporal)   Resp 18   Ht '4\' 11"'$  (1.499 m) Comment: with shoes  Wt 118 lb (53.5 kg)   SpO2 98%   BMI 23.83 kg/m  Body mass index is 23.83 kg/m.  General Appearance:  Alert, cooperative, no distress, appears stated age  Head:  Normocephalic, without obvious abnormality, atraumatic  Eyes:  Conjunctiva clear, EOM's intact  Nose: Nares normal, {Blank multiple:19196:a:"***","hypertrophic turbinates","normal mucosa","no visible anterior polyps","septum midline"}  Throat: Lips, tongue normal; teeth and gums normal, {Blank multiple:19196:a:"***","normal posterior oropharynx","tonsils 2+","tonsils 3+","no tonsillar exudate","+ cobblestoning"}  Neck: Supple, symmetrical  Lungs:   {Blank multiple:19196:a:"***","clear to auscultation bilaterally","end-expiratory wheezing","wheezing throughout"}, Respirations unlabored, {Blank multiple:19196:a:"***","no coughing","intermittent dry coughing"}  Heart:  {Blank multiple:19196:a:"***","regular rate and rhythm","no murmur"}, Appears well perfused  Extremities: No edema  Skin: Skin color, texture, turgor normal, no rashes or lesions on visualized portions of skin  Neurologic: No gross deficits   The plan was reviewed with the patient/family, and all questions/concerned were addressed.  It was my pleasure to see Debra Martin today  and participate in her care. Please feel free to contact me with any questions or concerns.  Sincerely,  Roney Marion, MD Allergy & Immunology  [facility] office: [phone] [facility] [place] office: [phone]

## 2022-01-31 NOTE — Patient Instructions (Addendum)
Seasonal and perennial rhinitis: Not well controlled  - Testing today showed positive to grass, weeds, trees, dust mite, cat, roach, mouse, tobacco leaf - Copy of test results provided.  - Avoidance measures provided.  - Continue with: Xyzal (levocetirizine) '5mg'$  tablet once daily - Start taking: Flonase (fluticasone) two sprays per nostril daily - You can use an extra dose of the antihistamine, if needed, for breakthrough symptoms.  - Consider nasal saline rinses 1-2 times daily to remove allergens from the nasal cavities as well as help with mucous clearance (this is especially helpful to do before the nasal sprays are given) - Consider allergy shots as a means of long-term control and can reduce lifetime use of medications  - Allergy shots "re-train" and "reset" the immune system to ignore environmental allergens and decrease the resulting immune response to those allergens (sneezing, itchy watery eyes, runny nose, nasal congestion, etc).    - Allergy shots improve symptoms in 75-85%  - Allergy shots are the only potential permanent and disease modifying option  - We can discuss more at the next appointment if the medications are not working for you.  Start allergy injections. Had a detailed discussion with patient/family that clinical history is suggestive of allergic rhinitis, and may benefit from allergy immunotherapy (AIT). Discussed in detail regarding the dosing, schedule, side effects (mild to moderate local allergic reaction and rarely systemic allergic reactions including anaphylaxis/death), alternatives and benefits (significant improvement in nasal symptoms, seasonal flares of asthma) of immunotherapy with the patient. There is significant time commitment involved with allergy shots, which includes weekly immunotherapy injections for first 9-12 months and then biweekly to monthly injections for 3-5 years. Clinical response is often delayed and patient may not see an improvement for 6-12  months. Consent was signed. I have prescribed epinephrine injectable and demonstrated proper use. For mild symptoms you can take over the counter antihistamines such as Benadryl and monitor symptoms closely. If symptoms worsen or if you have severe symptoms including breathing issues, throat closure, significant swelling, whole body hives, severe diarrhea and vomiting, lightheadedness then inject epinephrine and seek immediate medical care afterwards. Action plan given.   Food allergy:  - today's skin testing was negative to shrimp positive to crab and lobster negative to oyster and scallop - please strictly avoid crab - okay to resume eating shrimp and lobster as you have already introduced this we will get the blood work to double check for crab and consider oral food challenge for crab introduction in the clinic - for SKIN only reaction, okay to take Benadryl 1 capsules every 4 hours - for SKIN + ANY additional symptoms, OR IF concern for LIFE THREATENING reaction = Epipen Autoinjector EpiPen 0.3 mg. - If using Epinephrine autoinjector, call 911 - A food allergy action plan has been provided and discussed. - Medic Alert identification is recommended.   Follow up: in 4 weeks for initial Ait injections  We will contact with you based on labs for next step on shellfish allergy  Thank you so much for letting me partake in your care today.  Don't hesitate to reach out if you have any additional concerns!  Roney Marion, MD  [facility] [place] [place]

## 2022-02-02 ENCOUNTER — Encounter: Payer: Self-pay | Admitting: Internal Medicine

## 2022-02-02 LAB — ALLERGEN, CRAB, F23: F023-IgE Crab: 1.44 kU/L — AB

## 2022-02-05 DIAGNOSIS — J301 Allergic rhinitis due to pollen: Secondary | ICD-10-CM | POA: Diagnosis not present

## 2022-02-05 NOTE — Progress Notes (Signed)
Aeroallergen Immunotherapy   Ordering Provider: Dr. Roney Marion   Patient Details  Name: Debra Martin  MRN: [id number]  Date of Birth: 1971/03/11   Order 1 of 2   Vial Label: G-W-T-   0.3 ml (Volume)  BAU Concentration -- 7 Grass Mix* 100,000 ([address] [place], [place], [place], [place], [place], Sweet Vernal, Timothy)  0.2 ml (Volume)  1:20 Concentration -- Johnson  0.2 ml (Volume)  1:20 Concentration -- Cocklebur  0.2 ml (Volume)  1:20 Concentration -- Burweed [place]er  0.2 ml (Volume)  1:10 Concentration -- Plantain English  0.5 ml (Volume)  1:20 Concentration -- Weed Mix*  0.5 ml (Volume)  1:20 Concentration -- Eastern 10 Tree Mix (also Sweet Gum)  0.2 ml (Volume)  1:20 Concentration -- Box Elder  0.2 ml (Volume)  1:20 Concentration -- Walnut, Black Pollen    2.5  ml Extract Subtotal  2.5  ml Diluent  5.0  ml Maintenance Total   Schedule:  A  Blue Vial (1:100,000): Schedule B (6 doses)  Yellow Vial (1:10,000): Schedule B (6 doses)  Green Vial (1:1,000): Schedule B (6 doses)  Red Vial (1:100): Schedule A (10 doses)   Special Instructions: none

## 2022-02-05 NOTE — Progress Notes (Signed)
VIALS EXP 02-06-23

## 2022-02-05 NOTE — Progress Notes (Signed)
Aeroallergen Immunotherapy   Ordering Provider: Dr. Roney Marion   Patient Details  Name: Debra Martin  MRN: [id number]  Date of Birth: 1971-03-30   Order 2 of 2   Vial Label: DM-C-R   0.5 ml (Volume)  1:10 Concentration -- Cat Hair  0.3 ml (Volume)  1:20 Concentration -- Cockroach, German  0.5 ml (Volume)   AU Concentration -- Mite Mix (DF 5,000 & DP 5,000)    1.3  ml Extract Subtotal  3.7  ml Diluent  5.0  ml Maintenance Total   Schedule:  A  Blue Vial (1:100,000): Schedule B (6 doses)  Yellow Vial (1:10,000): Schedule B (6 doses)  Green Vial (1:1,000): Schedule B (6 doses)  Red Vial (1:100): Schedule A (10 doses)

## 2022-02-05 NOTE — Progress Notes (Signed)
Crab returned low at 1.44.  Based on testing recommend continued avoidance of crab for now.  If  she tolerates lobster and shrimp of 6 months we can consider Oral food Challenge at that time. Please let patient know.  Thanks!

## 2022-02-09 ENCOUNTER — Other Ambulatory Visit: Payer: Self-pay | Admitting: Family Medicine

## 2022-02-09 DIAGNOSIS — Z1231 Encounter for screening mammogram for malignant neoplasm of breast: Secondary | ICD-10-CM

## 2022-02-13 ENCOUNTER — Ambulatory Visit (INDEPENDENT_AMBULATORY_CARE_PROVIDER_SITE_OTHER): Payer: No Typology Code available for payment source

## 2022-02-13 DIAGNOSIS — J309 Allergic rhinitis, unspecified: Secondary | ICD-10-CM

## 2022-02-13 NOTE — Progress Notes (Signed)
Immunotherapy   Patient Details  Name: Evangela Heffler MRN: [id number] Date of Birth: 07-Sep-1970  02/13/2022  Bentlie Otila Back started injections for  Blue 1:100,000 (DM-C-R & G-W-T) Following schedule: A  Frequency:1 time per week Epi-Pen:Epi-Pen Available  Consent signed and patient instructions given.   Wilhemenia Camba J Emiko Osorto 02/13/2022, 12:20 PM

## 2022-02-19 ENCOUNTER — Ambulatory Visit (INDEPENDENT_AMBULATORY_CARE_PROVIDER_SITE_OTHER): Payer: No Typology Code available for payment source

## 2022-02-19 DIAGNOSIS — J309 Allergic rhinitis, unspecified: Secondary | ICD-10-CM

## 2022-02-22 DIAGNOSIS — J3089 Other allergic rhinitis: Secondary | ICD-10-CM | POA: Diagnosis not present

## 2022-02-22 NOTE — Progress Notes (Signed)
DATES FOR BILLING.

## 2022-02-28 ENCOUNTER — Ambulatory Visit (INDEPENDENT_AMBULATORY_CARE_PROVIDER_SITE_OTHER): Payer: No Typology Code available for payment source

## 2022-02-28 DIAGNOSIS — J309 Allergic rhinitis, unspecified: Secondary | ICD-10-CM | POA: Diagnosis not present

## 2022-03-08 ENCOUNTER — Encounter: Payer: Self-pay | Admitting: Internal Medicine

## 2022-03-08 ENCOUNTER — Ambulatory Visit: Payer: Self-pay

## 2022-03-08 ENCOUNTER — Ambulatory Visit (INDEPENDENT_AMBULATORY_CARE_PROVIDER_SITE_OTHER): Payer: No Typology Code available for payment source | Admitting: Internal Medicine

## 2022-03-08 VITALS — BP 112/72 | HR 84 | Temp 97.9°F | Resp 17 | Wt 119.0 lb

## 2022-03-08 DIAGNOSIS — J309 Allergic rhinitis, unspecified: Secondary | ICD-10-CM | POA: Diagnosis not present

## 2022-03-08 DIAGNOSIS — H1010 Acute atopic conjunctivitis, unspecified eye: Secondary | ICD-10-CM

## 2022-03-08 DIAGNOSIS — H1013 Acute atopic conjunctivitis, bilateral: Secondary | ICD-10-CM | POA: Diagnosis not present

## 2022-03-08 DIAGNOSIS — T7800XA Anaphylactic reaction due to unspecified food, initial encounter: Secondary | ICD-10-CM

## 2022-03-08 DIAGNOSIS — J302 Other seasonal allergic rhinitis: Secondary | ICD-10-CM

## 2022-03-08 MED ORDER — "BD TB SYRINGE 27G X 1/2"" 1 ML MISC": 5 refills | Status: DC

## 2022-03-08 MED ORDER — "BD ECLIPSE NEEDLE 27G X 1/2"" MISC": 5 refills | Status: DC

## 2022-03-08 NOTE — Progress Notes (Signed)
FOLLOW UP Date of Service/Encounter:  03/08/22   Subjective:  Debra Martin (DOB: 03/02/1971) is a 51 y.o. female who returns to the [facility] [place] on 03/08/2022 in re-evaluation of the following:  rhinitis, food allergy and drug allergy History obtained from: chart review and patient.  For Review, LV was on 01/31/22  with Dr. Edison Pace seen for intial visit for  rhinitis, food allergy and drug allergy .  Today presents for follow-up. She started allergy injections on 02/05/22 and is tolerating them well without any adverse events.  She is using her PAL time to come here for her injections.  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  She continues taking her daily antihistamine and has Flonase that she uses if needed. She also feels her eyes are sometime red more so than prior to starting the shots, but she is using over-the-counter eyedrops which help. She is eating shrimp and lobster without any symptoms.  She continues to avoid crab.   Allergies as of 03/08/2022       Reactions   Gold-containing Drug Products Other (See Comments)   Ibuprofen Other (See Comments)   Shellfish-derived Products Hives, Itching, Rash, Swelling        Medication List        Accurate as of March 08, 2022  3:55 PM. If you have any questions, ask your nurse or doctor.          EPINEPHrine 0.3 mg/0.3 mL Soaj injection Commonly known as: EPI-PEN Use as directed for severe allergic reaction   EPINEPHrine 0.3 mg/0.3 mL Soaj injection Commonly known as: EPI-PEN Inject 0.3 mg into the muscle as needed for anaphylaxis.   fluticasone 50 MCG/ACT nasal spray Commonly known as: FLONASE Place 1 spray into both nostrils 2 (two) times daily.   ibuprofen 600 MG tablet Commonly known as: ADVIL Take 600 mg by mouth every 6 (six) hours as needed.    levocetirizine 5 MG tablet Commonly known as: XYZAL TAKE 1 TABLET (5 MG TOTAL) BY MOUTH EVERY EVENING.   multivitamin with minerals tablet Take 1 tablet by mouth daily.   Opcon-A 0.027-0.315 % Soln Generic drug: Naphazoline-Pheniramine Apply to eye.   sodium chloride 0.65 % nasal spray Commonly known as: OCEAN 2 sprays in each nostril as needed       Past Medical History:  Diagnosis Date   AMA (advanced maternal age) multigravida 35+    Anemia    Bulging disc    Constipation, chronic    H/O migraine    H/O varicella    Short stature    Past Surgical History:  Procedure Laterality Date   BREAST CYST EXCISION Right 1990   remove cyst from breast  1995   Otherwise, there have been no changes to her past medical history, surgical history, family history, or social history.  ROS: All others negative except as noted per HPI.   Objective:  BP 112/72   Pulse 84   Temp 97.9 F (36.6 C) (Temporal)   Resp 17   Wt 119 lb (54 kg)   SpO2 98%   BMI 24.04 kg/m  Body mass index is 24.04 kg/m. Physical Exam: General Appearance:  Alert, cooperative, no distress, appears stated age  Head:  Normocephalic, without obvious abnormality, atraumatic  Eyes:  Conjunctiva clear, EOM's intact  Nose: Nares normal, hypertrophic turbinates, normal mucosa, no visible anterior polyps, and septum midline  Throat: Lips, tongue normal; teeth and gums normal, normal posterior oropharynx  Neck: Supple, symmetrical  Lungs:   clear to auscultation bilaterally, Respirations unlabored, no coughing  Heart:  regular rate and rhythm and no murmur, Appears well perfused  Extremities: No edema  Skin: Skin color, texture, turgor normal, no rashes or lesions on visualized portions of skin  Neurologic: No gross deficits   Assessment/Plan   Seasonal and perennial rhinitis: Improving -Allergen avoidance towards grass, weeds, trees, dust mite, cat, roach, mouse,   - Continue with: Xyzal  (levocetirizine) '5mg'$  tablet once daily and Flonase (fluticasone) two sprays per nostril daily - You can use an extra dose of the antihistamine, if needed, for breakthrough symptoms.  - Consider nasal saline rinses 1-2 times daily to remove allergens from the nasal cavities as well as help with mucous clearance (this is especially helpful to do before the nasal sprays are given) - Continue allergy injections per protocol, we will coordinate getting your injections at your work once we receive consent; continue to carry an up-to-date epineprhine autoinjector  Food allergy: Stable - previous skin testing was negative to shrimp positive to crab and lobster negative to oyster and scallop; blood work positive to crab - please strictly avoid crab; discuss oral challenge at follow-up visit - okay to continue eating shrimp and lobster since you are tolerating - for SKIN only reaction, okay to take Benadryl 1 capsules every 4 hours - for SKIN + ANY additional symptoms, OR IF concern for LIFE THREATENING reaction = Epipen Autoinjector EpiPen 0.3 mg. - If using Epinephrine autoinjector, call 911 - A food allergy action plan has been reviewed. - Medic Alert identification is recommended.  Follow up: in 6 months, sooner if needed with Dr. Edison Pace   Thank you so much for letting me partake in your care today.  Don't hesitate to reach out if you have any additional concerns!  Sigurd Sos, MD  [facility] [place] [facility] [place]

## 2022-03-08 NOTE — Patient Instructions (Addendum)
Seasonal and perennial rhinitis:  -Allergen avoidance towards grass, weeds, trees, dust mite, cat, roach, mouse,   - Continue with: Xyzal (levocetirizine) '5mg'$  tablet once daily and Flonase (fluticasone) two sprays per nostril daily - You can use an extra dose of the antihistamine, if needed, for breakthrough symptoms.  - Consider nasal saline rinses 1-2 times daily to remove allergens from the nasal cavities as well as help with mucous clearance (this is especially helpful to do before the nasal sprays are given) - Continue allergy injections per protocol, we will coordinate getting your injections at your work once we receive consent; continue to carry an up-to-date epineprhine autoinjector  Food allergy:  - previous skin testing was negative to shrimp positive to crab and lobster negative to oyster and scallop; blood work positive to crab - please strictly avoid crab; discuss oral challenge at follow-up visit - okay to continue eating shrimp and lobster since you are tolerating - for SKIN only reaction, okay to take Benadryl 1 capsules every 4 hours - for SKIN + ANY additional symptoms, OR IF concern for LIFE THREATENING reaction = Epipen Autoinjector EpiPen 0.3 mg. - If using Epinephrine autoinjector, call 911 - A food allergy action plan has been reviewed. - Medic Alert identification is recommended.  Follow up: in 6 months, sooner if needed with Dr. Edison Pace   Thank you so much for letting me partake in your care today.  Don't hesitate to reach out if you have any additional concerns!  Sigurd Sos, MD [facility]

## 2022-03-09 ENCOUNTER — Ambulatory Visit: Payer: No Typology Code available for payment source | Admitting: Internal Medicine

## 2022-03-13 ENCOUNTER — Ambulatory Visit (INDEPENDENT_AMBULATORY_CARE_PROVIDER_SITE_OTHER): Payer: No Typology Code available for payment source

## 2022-03-13 DIAGNOSIS — J309 Allergic rhinitis, unspecified: Secondary | ICD-10-CM

## 2022-03-13 NOTE — Progress Notes (Signed)
Immunotherapy   Patient Details  Name: Debra Martin MRN: [id number] Date of Birth: 11-10-1970  03/13/2022  Monte Otila Back here to pick up Blue 1:100,000 (G-W-T and DM-C-R)  Following schedule: A  Frequency:1 time per week Epi-Pen:Epi-Pen Available  Patient is taking her vials to [facility] where she works.  Consent signed and patient instructions given.   Buryl Bamber J Muhsin Doris 03/13/2022, 10:01 AM

## 2022-04-18 ENCOUNTER — Ambulatory Visit: Payer: No Typology Code available for payment source

## 2022-04-20 ENCOUNTER — Ambulatory Visit (INDEPENDENT_AMBULATORY_CARE_PROVIDER_SITE_OTHER): Payer: No Typology Code available for payment source | Admitting: *Deleted

## 2022-04-20 DIAGNOSIS — J309 Allergic rhinitis, unspecified: Secondary | ICD-10-CM

## 2022-07-04 ENCOUNTER — Ambulatory Visit: Payer: No Typology Code available for payment source

## 2022-07-12 ENCOUNTER — Ambulatory Visit
Admission: RE | Admit: 2022-07-12 | Discharge: 2022-07-12 | Disposition: A | Discharge status: Home / Self Care | Payer: No Typology Code available for payment source | Source: Ambulatory Visit | Attending: Family Medicine | Admitting: Family Medicine

## 2022-07-12 DIAGNOSIS — Z1231 Encounter for screening mammogram for malignant neoplasm of breast: Secondary | ICD-10-CM

## 2022-07-17 ENCOUNTER — Ambulatory Visit (INDEPENDENT_AMBULATORY_CARE_PROVIDER_SITE_OTHER): Payer: No Typology Code available for payment source

## 2022-07-17 DIAGNOSIS — J309 Allergic rhinitis, unspecified: Secondary | ICD-10-CM

## 2022-07-17 NOTE — Progress Notes (Signed)
Immunotherapy   Patient Details  Name: Debra Martin MRN: [id number] Date of Birth: 08-25-1971  07/17/2022  Minta Otila Back here to pick up Green 1:1000 (G-W-T and DM-C-R) Following schedule: A  Frequency:1 time per week Epi-Pen:Epi-Pen Available  Consent signed and patient instructions given. Patient is taking vials to her work to receive injections there.   Amye Grego J Trase Bunda 07/17/2022, 4:05 PM

## 2022-08-24 ENCOUNTER — Other Ambulatory Visit: Payer: Self-pay

## 2022-08-24 ENCOUNTER — Ambulatory Visit (INDEPENDENT_AMBULATORY_CARE_PROVIDER_SITE_OTHER): Payer: No Typology Code available for payment source | Admitting: Family Medicine

## 2022-08-24 ENCOUNTER — Encounter: Payer: Self-pay | Admitting: Family Medicine

## 2022-08-24 VITALS — BP 102/76 | HR 90 | Temp 98.1°F | Resp 16 | Wt 123.2 lb

## 2022-08-24 DIAGNOSIS — H1013 Acute atopic conjunctivitis, bilateral: Secondary | ICD-10-CM | POA: Diagnosis not present

## 2022-08-24 DIAGNOSIS — H1010 Acute atopic conjunctivitis, unspecified eye: Secondary | ICD-10-CM

## 2022-08-24 DIAGNOSIS — J302 Other seasonal allergic rhinitis: Secondary | ICD-10-CM

## 2022-08-24 DIAGNOSIS — J3089 Other allergic rhinitis: Secondary | ICD-10-CM | POA: Diagnosis not present

## 2022-08-24 DIAGNOSIS — T7800XD Anaphylactic reaction due to unspecified food, subsequent encounter: Secondary | ICD-10-CM

## 2022-08-24 MED ORDER — EPINEPHRINE 0.3 MG/0.3ML IJ SOAJ: INTRAMUSCULAR | 1 refills | Status: AC

## 2022-08-24 NOTE — Patient Instructions (Signed)
Allergic rhinitis Continue allergen avoidance measures directed toward grass pollen, weed pollen, tree pollen, dust mite, cat, and cockroach as listed below Continue allergen immunotherapy and have access to an epinephrine autoinjector set Continue Allegra 180 mg once a day as needed for runny nose or itch. Remember to rotate to a different antihistamine about every 3 months. Some examples of over the counter antihistamines include Zyrtec (cetirizine), Xyzal (levocetirizine), Allegra (fexofenadine), and Claritin (loratidine).  Consider saline nasal rinses as needed for nasal symptoms. Use this before any medicated nasal sprays for best result  Food allergy Continue to avoid crab.  In case of an allergic reaction, take Benadryl 50 mg every 4 hours, and if life-threatening symptoms occur, inject with EpiPen 0.3 mg. Contact the clinic if you are interested in a possible food challenge to crab.  Remember to stop antihistamines for 3 days before your challenge date.  Call the clinic if this treatment plan is not working well for you.  Follow up in 1 year or sooner if needed.  Reducing Pollen Exposure The American Academy of Allergy, Asthma and Immunology suggests the following steps to reduce your exposure to pollen during allergy seasons. Do not hang sheets or clothing out to dry; pollen may collect on these items. Do not mow lawns or spend time around freshly cut grass; mowing stirs up pollen. Keep windows closed at night.  Keep car windows closed while driving. Minimize morning activities outdoors, a time when pollen counts are usually at their highest. Stay indoors as much as possible when pollen counts or humidity is high and on windy days when pollen tends to remain in the air longer. Use air conditioning when possible.  Many air conditioners have filters that trap the pollen spores. Use a HEPA room air filter to remove pollen form the indoor air you breathe.  Control of Dog or Cat  Allergen Avoidance is the best way to manage a dog or cat allergy. If you have a dog or cat and are allergic to dog or cats, consider removing the dog or cat from the home. If you have a dog or cat but don't want to find it a new home, or if your family wants a pet even though someone in the household is allergic, here are some strategies that may help keep symptoms at bay:  Keep the pet out of your bedroom and restrict it to only a few rooms. Be advised that keeping the dog or cat in only one room will not limit the allergens to that room. Don't pet, hug or kiss the dog or cat; if you do, wash your hands with soap and water. High-efficiency particulate air (HEPA) cleaners run continuously in a bedroom or living room can reduce allergen levels over time. Regular use of a high-efficiency vacuum cleaner or a central vacuum can reduce allergen levels. Giving your dog or cat a bath at least once a week can reduce airborne allergen.   Control of Dust Mite Allergen Dust mites play a major role in allergic asthma and rhinitis. They occur in environments with high humidity wherever human skin is found. Dust mites absorb humidity from the atmosphere (ie, they do not drink) and feed on organic matter (including shed human and animal skin). Dust mites are a microscopic type of insect that you cannot see with the naked eye. High levels of dust mites have been detected from mattresses, pillows, carpets, upholstered furniture, bed covers, clothes, soft toys and any woven material. The principal allergen of  the dust mite is found in its feces. A gram of dust may contain 1,000 mites and 250,000 fecal particles. Mite antigen is easily measured in the air during house cleaning activities. Dust mites do not bite and do not cause harm to humans, other than by triggering allergies/asthma.  Ways to decrease your exposure to dust mites in your home:  1. Encase mattresses, box springs and pillows with a mite-impermeable  barrier or cover  2. Wash sheets, blankets and drapes weekly in hot water (130 F) with detergent and dry them in a dryer on the hot setting.  3. Have the room cleaned frequently with a vacuum cleaner and a damp dust-mop. For carpeting or rugs, vacuuming with a vacuum cleaner equipped with a high-efficiency particulate air (HEPA) filter. The dust mite allergic individual should not be in a room which is being cleaned and should wait 1 hour after cleaning before going into the room.  4. Do not sleep on upholstered furniture (eg, couches).  5. If possible removing carpeting, upholstered furniture and drapery from the home is ideal. Horizontal blinds should be eliminated in the rooms where the person spends the most time (bedroom, study, television room). Washable vinyl, roller-type shades are optimal.  6. Remove all non-washable stuffed toys from the bedroom. Wash stuffed toys weekly like sheets and blankets above.  7. Reduce indoor humidity to less than 50%. Inexpensive humidity monitors can be purchased at most hardware stores. Do not use a humidifier as can make the problem worse and are not recommended.   Control of Cockroach Allergen Cockroach allergen has been identified as an important cause of acute attacks of asthma, especially in urban settings.  There are fifty-five species of cockroach that exist in the [place], however only three, the [place], [place] species produce allergen that can affect patients with Asthma.  Allergens can be obtained from fecal particles, egg casings and secretions from cockroaches.    Remove food sources. Reduce access to water. Seal access and entry points. Spray runways with 0.5-1% Diazinon or Chlorpyrifos Blow boric acid power under stoves and refrigerator. Place bait stations (hydramethylnon) at feeding sites.

## 2022-08-24 NOTE — Progress Notes (Signed)
[place] [id number] Dept: [phone]  FOLLOW UP NOTE  Patient ID: Debra Martin, female    DOB: July 13, 1971  Age: 51 y.o. MRN: [id number] Date of Office Visit: 08/24/2022  Assessment  Chief Complaint: Allergic Rhinitis   HPI Debra Martin is a 51 year old female who presents to the clinic for follow-up visit.  She was last seen in this clinic on 03/08/2022 by Dr. 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  At today's visit, she reports her allergies have been moderately well-controlled with symptoms including occasional sneeze, clear rhinorrhea that began last week and has resolved at this time, occasional sneeze, and postnasal drainage in the winter.  She continues Allegra 180 mg once a day, Flonase daily, and nasal saline rinses daily.  She began allergen immunotherapy directed toward grass pollen, weed pollen, tree pollen, dust mite, cat, and cockroach on 02/05/2022, and cockroach.  She continues allergen immunotherapy with no larger local reactions.  She reports a moderate decrease in her symptoms of allergic rhinitis while continuing on allergen immunotherapy.  Allergic conjunctivitis is reported as well controlled with no current medical intervention.  She continues to avoid crab and interestingly enough can eat shrimp and lobster.  She reports that she did eat a piece of crab several months ago which resulted in oral itching for which she took Benadryl with relief of symptoms.  Her last food allergy testing was on 01/31/2022 and measured 3 x 10 with lab testing on the same day measuring 1.44 crab IgE.  She is moderately interested in an in clinic oral food challenge to crab.  Her current medications are listed in the chart.  Drug Allergies:  Allergies  Allergen Reactions   Gold-Containing Drug Products Other (See Comments)   Ibuprofen Other (See Comments)   Shellfish-Derived Products Hives, Itching, Rash and  Swelling    Physical Exam: BP 102/76   Pulse 90   Temp 98.1 F (36.7 C) (Temporal)   Resp 16   Wt 123 lb 3.2 oz (55.9 kg)   SpO2 100%   BMI 24.88 kg/m    Physical Exam Vitals reviewed.  Constitutional:      Appearance: Normal appearance.  HENT:     Head: Normocephalic and atraumatic.     Right Ear: Tympanic membrane normal.     Left Ear: Tympanic membrane normal.     Nose:     Comments: Bilateral nares slightly erythematous with clear nasal drainage noted.  Pharynx normal.  Ears normal.  Eyes normal.    Mouth/Throat:     Pharynx: Oropharynx is clear.  Eyes:     Conjunctiva/sclera: Conjunctivae normal.  Cardiovascular:     Rate and Rhythm: Normal rate and regular rhythm.     Heart sounds: Normal heart sounds. No murmur heard. Pulmonary:     Effort: Pulmonary effort is normal.     Breath sounds: Normal breath sounds.     Comments: Lungs clear to auscultation Musculoskeletal:        General: Normal range of motion.     Cervical back: Normal range of motion and neck supple.  Skin:    General: Skin is warm and dry.  Neurological:     Mental Status: She is alert and oriented to person, place, and time.  Psychiatric:        Mood and Affect: Mood normal.        Behavior: Behavior normal.  Thought Content: Thought content normal.        Judgment: Judgment normal.     Assessment and Plan: 1. Seasonal and perennial allergic rhinitis   2. Seasonal allergic conjunctivitis   3. Anaphylactic shock due to food, subsequent encounter     Meds ordered this encounter  Medications   EPINEPHrine 0.3 mg/0.3 mL IJ SOAJ injection    Sig: Use as directed for severe allergic reaction    Dispense:  2 each    Refill:  1    Patient Instructions  Allergic rhinitis Continue allergen avoidance measures directed toward grass pollen, weed pollen, tree pollen, dust mite, cat, and cockroach as listed below Continue allergen immunotherapy and have access to an epinephrine  autoinjector set Continue Allegra 180 mg once a day as needed for runny nose or itch. Remember to rotate to a different antihistamine about every 3 months. Some examples of over the counter antihistamines include Zyrtec (cetirizine), Xyzal (levocetirizine), Allegra (fexofenadine), and Claritin (loratidine).  Consider saline nasal rinses as needed for nasal symptoms. Use this before any medicated nasal sprays for best result  Food allergy Continue to avoid crab.  In case of an allergic reaction, take Benadryl 50 mg every 4 hours, and if life-threatening symptoms occur, inject with EpiPen 0.3 mg. Contact the clinic if you are interested in a possible food challenge to crab.  Remember to stop antihistamines for 3 days before your challenge date.  Call the clinic if this treatment plan is not working well for you.  Follow up in 1 year or sooner if needed.   Return in about 1 year (around 08/25/2023), or if symptoms worsen or fail to improve.    Thank you for the opportunity to care for this patient.  Please do not hesitate to contact me with questions.  Gareth Morgan, FNP [facility]

## 2022-09-17 DIAGNOSIS — J3089 Other allergic rhinitis: Secondary | ICD-10-CM

## 2022-09-17 NOTE — Progress Notes (Signed)
VIALS EXP 09-18-23

## 2022-09-21 ENCOUNTER — Ambulatory Visit (INDEPENDENT_AMBULATORY_CARE_PROVIDER_SITE_OTHER): Payer: No Typology Code available for payment source

## 2022-09-21 DIAGNOSIS — J309 Allergic rhinitis, unspecified: Secondary | ICD-10-CM

## 2022-09-21 NOTE — Progress Notes (Signed)
Immunotherapy   Patient Details  Name: Debra Martin MRN: [id number] Date of Birth: [birth year]  09/21/2022  Mecca Otila Back here to pick up #1 Red 1:100 (DM-C-R and G-W-T) @ 0.05 given Following schedule: A  Frequency:1 time per week Epi-Pen:Epi-Pen Available  Consent signed and patient instructions given.    Oronde Hallenbeck J Angelgabriel Willmore 09/21/2022, 10:38 AM

## 2022-12-14 ENCOUNTER — Ambulatory Visit: Payer: No Typology Code available for payment source

## 2022-12-19 DIAGNOSIS — J3089 Other allergic rhinitis: Secondary | ICD-10-CM | POA: Diagnosis not present

## 2022-12-20 NOTE — Progress Notes (Signed)
Vials exp 12-20-23

## 2022-12-27 ENCOUNTER — Ambulatory Visit (INDEPENDENT_AMBULATORY_CARE_PROVIDER_SITE_OTHER): Payer: No Typology Code available for payment source

## 2022-12-27 DIAGNOSIS — J309 Allergic rhinitis, unspecified: Secondary | ICD-10-CM | POA: Diagnosis not present

## 2022-12-27 NOTE — Progress Notes (Signed)
Immunotherapy   Patient Details  Name: Debra Martin MRN: [id number] Date of Birth: Oct 03, 1970  12/27/2022  [name] here to pick up  for red vials G-W-T & DM-C-R @ 0.10 Following schedule: A  Frequency:1 time per week Epi-Pen:Epi-Pen Available  Consent signed and patient instructions given.   Jacqulyn Cane 12/27/2022, 10:02 AM

## 2022-12-28 ENCOUNTER — Ambulatory Visit: Payer: No Typology Code available for payment source

## 2023-01-11 ENCOUNTER — Ambulatory Visit: Payer: No Typology Code available for payment source

## 2023-02-28 DIAGNOSIS — J3089 Other allergic rhinitis: Secondary | ICD-10-CM | POA: Diagnosis not present

## 2023-02-28 NOTE — Progress Notes (Signed)
VIALS EXP 02-28-24

## 2023-03-04 ENCOUNTER — Ambulatory Visit: Payer: No Typology Code available for payment source

## 2023-03-04 NOTE — Progress Notes (Signed)
Immunotherapy   Patient Details  Name: Shandiin Woolums MRN: [id number] Date of Birth: 1970/10/26  03/04/2023  [name] daughter is here to pick up #3 Red 1:100 (DM-C-CR and G-W-T) OK per Dr. Marlynn Perking Following schedule: C Frequency:1 time per week, at .50 Q 2 weeks Epi-Pen:Epi-Pen Available  Consent signed and patient instructions given. Patient gets her injections at her job. *Addendum to paperwork sent out by daughter. Called and spoke to patient advising that her paperwork should have said Schedule C instead on Schedule A. Went over schedule C with patient. She verbalized understanding and will change to schedule C on papers.    Shylah Dossantos J Taron Conrey 03/04/2023, 10:57 AM

## 2023-03-11 ENCOUNTER — Ambulatory Visit: Payer: No Typology Code available for payment source

## 2023-03-22 ENCOUNTER — Encounter: Payer: Self-pay | Admitting: Family Medicine

## 2023-03-22 NOTE — Telephone Encounter (Signed)
Can you please let this patient know that we do not allow any patients to self administer allergen immunotherapy per medical board of [place] guidelines. Please let them know that she can get this injection in a medical office with a reciprocal agreement. So sorry for the inconvenience.  Thank you

## 2023-05-27 DIAGNOSIS — J301 Allergic rhinitis due to pollen: Secondary | ICD-10-CM | POA: Diagnosis not present

## 2023-05-27 NOTE — Progress Notes (Signed)
VIALS EXP 05-26-24

## 2023-06-14 ENCOUNTER — Other Ambulatory Visit: Payer: Self-pay | Admitting: Family Medicine

## 2023-06-14 ENCOUNTER — Ambulatory Visit (INDEPENDENT_AMBULATORY_CARE_PROVIDER_SITE_OTHER): Payer: No Typology Code available for payment source

## 2023-06-14 DIAGNOSIS — Z1231 Encounter for screening mammogram for malignant neoplasm of breast: Secondary | ICD-10-CM

## 2023-06-14 DIAGNOSIS — J309 Allergic rhinitis, unspecified: Secondary | ICD-10-CM

## 2023-06-14 NOTE — Progress Notes (Signed)
Immunotherapy   Patient Details  Name: Estefana Taylor MRN: [id number] Date of Birth: 03/04/1971  06/14/2023  Zakira Olive Bass Came to pick up new red vials. Received .1 in the office without any reactions. Following schedule: C  Frequency: weekly, @ .5 every 2 weeks Epi-Pen: yes  Consent signed and patient instructions given.   Florence Canner 06/14/2023, 9:20 AM

## 2023-07-15 ENCOUNTER — Ambulatory Visit: Payer: No Typology Code available for payment source | Admitting: Internal Medicine

## 2023-07-15 ENCOUNTER — Ambulatory Visit
Admission: RE | Admit: 2023-07-15 | Discharge: 2023-07-15 | Disposition: A | Discharge status: Home / Self Care | Payer: No Typology Code available for payment source | Source: Ambulatory Visit

## 2023-07-15 DIAGNOSIS — Z1231 Encounter for screening mammogram for malignant neoplasm of breast: Secondary | ICD-10-CM

## 2023-07-15 DIAGNOSIS — J302 Other seasonal allergic rhinitis: Secondary | ICD-10-CM

## 2023-07-15 DIAGNOSIS — J3089 Other allergic rhinitis: Secondary | ICD-10-CM | POA: Diagnosis not present

## 2023-07-15 NOTE — Progress Notes (Unsigned)
  Date of Service/Encounter:  07/17/23  Allergy testing appointment   Initial visit on 02/01/23, seen for allergic rhinitis.  Please see that note for additional details.  Today reports for allergy diagnostic testing:    DIAGNOSTICS:  Skin Testing: Environmental allergy panel. Adequate positive and negative controls Results discussed with patient/family.  Airborne Adult Perc - 07/15/23 1504     Time Antigen Placed 1504    Allergen Manufacturer Waynette Buttery    Location Back    Number of Test 55    1. Control-Buffer 50% Glycerol Negative    2. Control-Histamine 4+    3. Bahia Negative    4. French [place] Negative    5. Johnson Negative    6. Kentucky Blue Negative    7. Meadow Fescue Negative    8. Perennial Rye 2+    9. Timothy Negative    10. Ragweed Mix Negative    11. Cocklebur Negative    12. Plantain,  English Negative    13. Baccharis Negative    14. Dog Fennel Negative    15. Guernsey Thistle 2+    16. Lamb's Quarters Negative    17. Sheep Sorrell Negative    18. Rough Pigweed Negative    19. Marsh Elder, Rough Negative    20. Mugwort, Common Negative    21. Box, Elder 3+    22. Cedar, red Negative    23. Sweet Gum 2+    24. Pecan Pollen 4+    25. Pine Mix Negative    26. Walnut, Black Pollen 4+    27. Red Mulberry Negative    28. Ash Mix 2+    29. Birch Mix Negative    30. Beech American Negative    31. Cottonwood, Guinea[place] Negative    32. Hickory, White 4+    33. Maple Mix 3+    34. Oak, [place] Mix 4+    35. Sycamore Eastern Negative    36. Alternaria Alternata Negative    37. Cladosporium Herbarum Negative    38. Aspergillus Mix Negative    39. Penicillium Mix Negative    40. Bipolaris Sorokiniana (Helminthosporium) Negative    41. Drechslera Spicifera (Curvularia) 3+    42. Mucor Plumbeus Negative    43. Fusarium Moniliforme Negative    44. Aureobasidium Pullulans (pullulara) Negative    45. Rhizopus Oryzae Negative    46. Botrytis Cinera 3+    47.  Epicoccum Nigrum Negative    48. Phoma Betae Negative    49. Dust Mite Mix 4+    50. Cat Hair 10,000 BAU/ml 4+    51.  Dog Epithelia Negative    52. Mixed Feathers Negative    53. Horse Epithelia 2+    54. Cockroach, German 4+    55. Tobacco Leaf 4+             Allergy testing results were read and interpreted by myself, documented by clinical staff.  Patient provided with copy of allergy testing along with avoidance measures when indicated.   Ferol Luz, MD  [facility]

## 2023-07-15 NOTE — Patient Instructions (Signed)
Allergic rhinitis Allergy test today positive to trees, dust mite, cat, roach, tobacco leaf, much less positive to perennial rye grass, Russian thistle, mold overall improved from prior Continue allergen immunotherapy and have access to an epinephrine autoinjector set Continue Allegra 180 mg once a day as needed for runny nose or itch. Remember to rotate to a different antihistamine about every 3 months. Some examples of over the counter antihistamines include Zyrtec (cetirizine), Xyzal (levocetirizine), Allegra (fexofenadine), and Claritin (loratidine).  Consider saline nasal rinses as needed for nasal symptoms. Use this before any medicated nasal sprays for best result  Call the clinic if this treatment plan is not working well for you.  Follow up in 1 year or sooner if needed. Recommend continuing allergy immunotherapy for at least another year and a half for goal at a minimum 3 years of treatment prior to stopping.

## 2023-07-17 ENCOUNTER — Other Ambulatory Visit: Payer: Self-pay | Admitting: Family Medicine

## 2023-07-17 DIAGNOSIS — R928 Other abnormal and inconclusive findings on diagnostic imaging of breast: Secondary | ICD-10-CM

## 2023-07-27 ENCOUNTER — Other Ambulatory Visit: Payer: No Typology Code available for payment source

## 2023-07-27 ENCOUNTER — Ambulatory Visit
Admission: RE | Admit: 2023-07-27 | Discharge: 2023-07-27 | Disposition: A | Discharge status: Home / Self Care | Payer: No Typology Code available for payment source | Source: Ambulatory Visit | Attending: Family Medicine | Admitting: Family Medicine

## 2023-07-27 ENCOUNTER — Ambulatory Visit: Payer: No Typology Code available for payment source

## 2023-07-27 DIAGNOSIS — R928 Other abnormal and inconclusive findings on diagnostic imaging of breast: Secondary | ICD-10-CM

## 2023-08-06 ENCOUNTER — Ambulatory Visit: Payer: No Typology Code available for payment source | Admitting: Internal Medicine

## 2023-10-08 DIAGNOSIS — J3089 Other allergic rhinitis: Secondary | ICD-10-CM | POA: Diagnosis not present

## 2023-10-08 NOTE — Progress Notes (Signed)
VIALS EXP 10-07-24

## 2023-10-18 ENCOUNTER — Ambulatory Visit: Payer: No Typology Code available for payment source | Admitting: *Deleted

## 2023-10-18 DIAGNOSIS — J309 Allergic rhinitis, unspecified: Secondary | ICD-10-CM

## 2023-11-09 IMAGING — DX DG CHEST 2V
2 series · 2 of 2 positions shown · non-contrast
Comparison: 09/05/2007

CLINICAL DATA: 50-year-old female with a history of cough for 6
weeks

EXAM:
CHEST - 2 VIEW

[dg chest 2 view (1 of 2)]
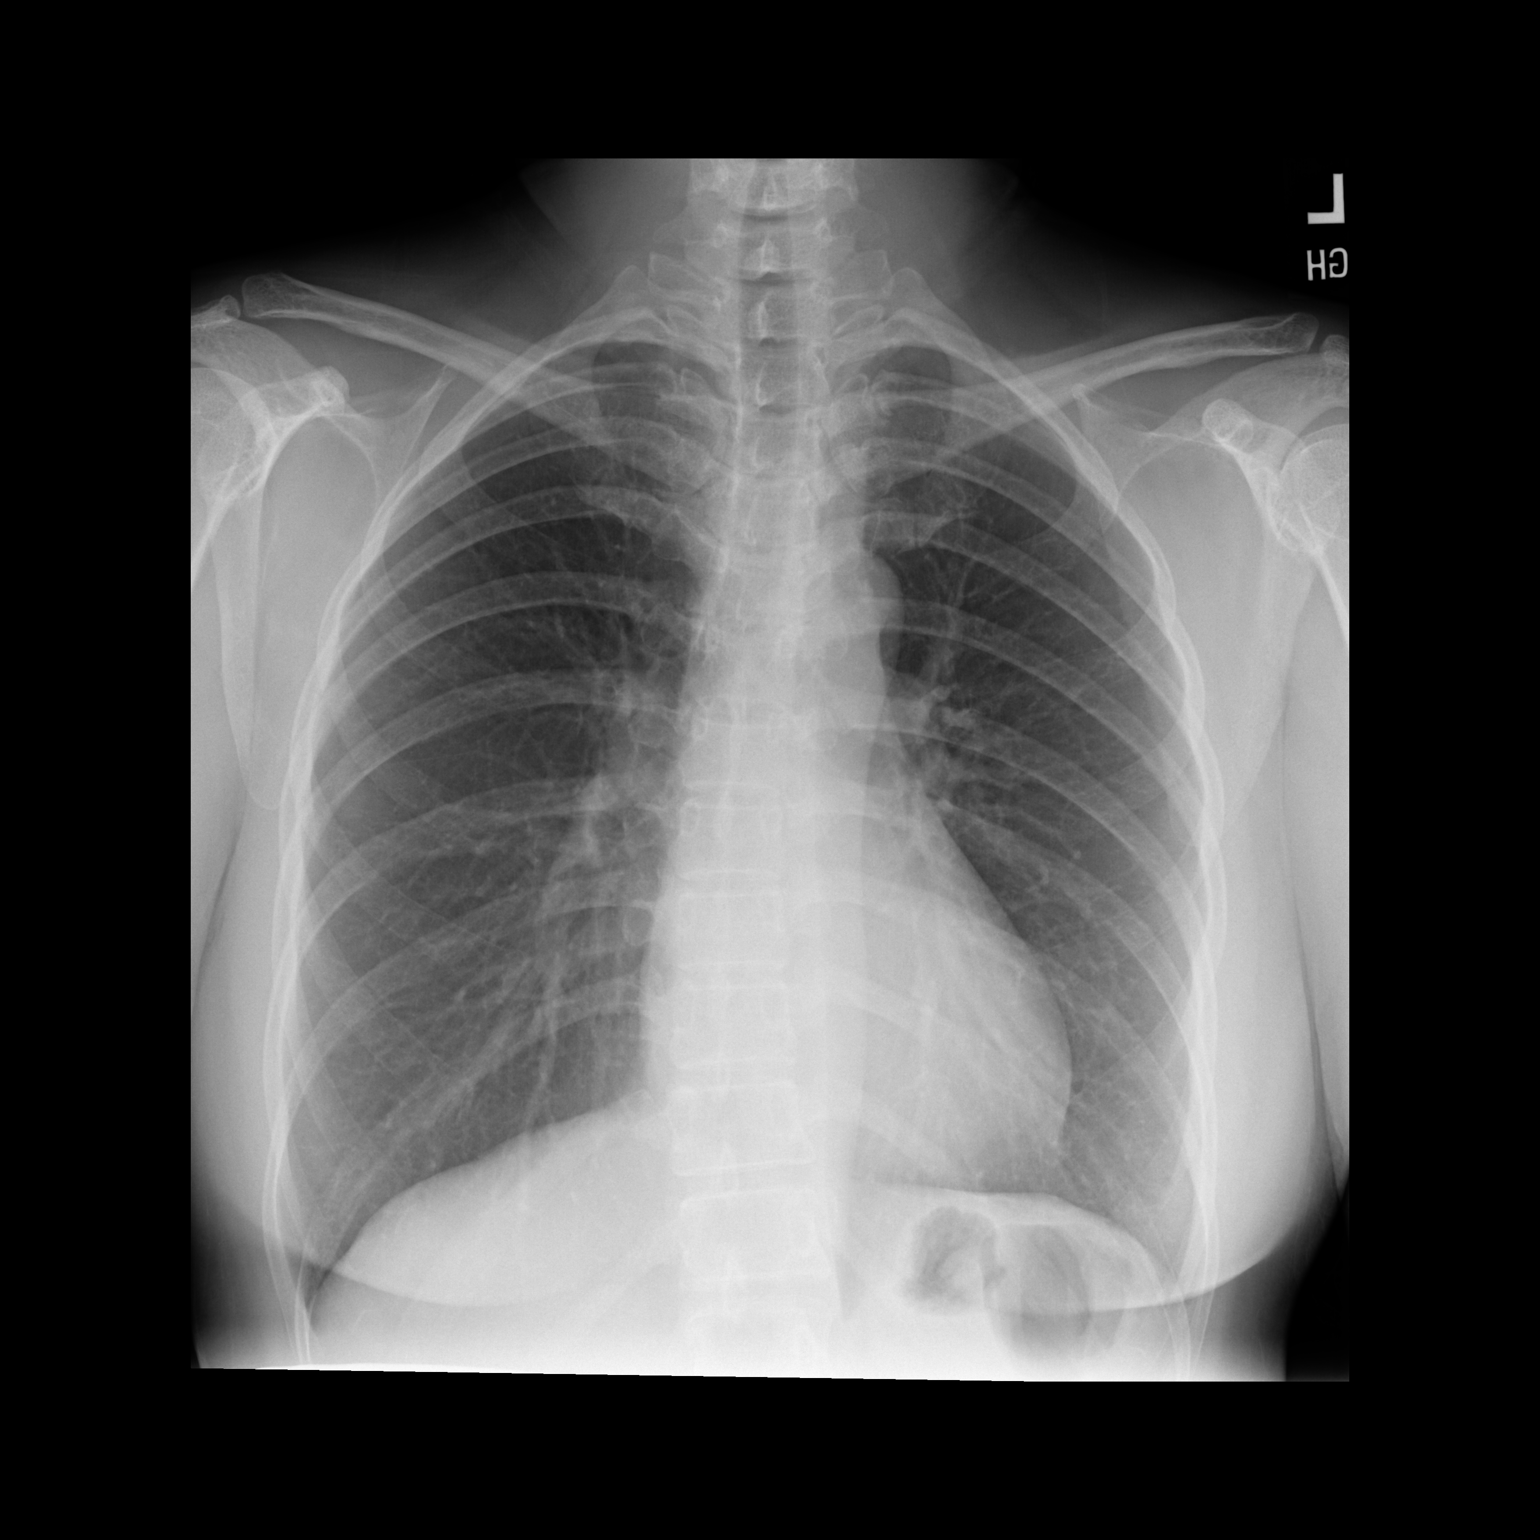

[dg chest 2 view (2 of 2)]
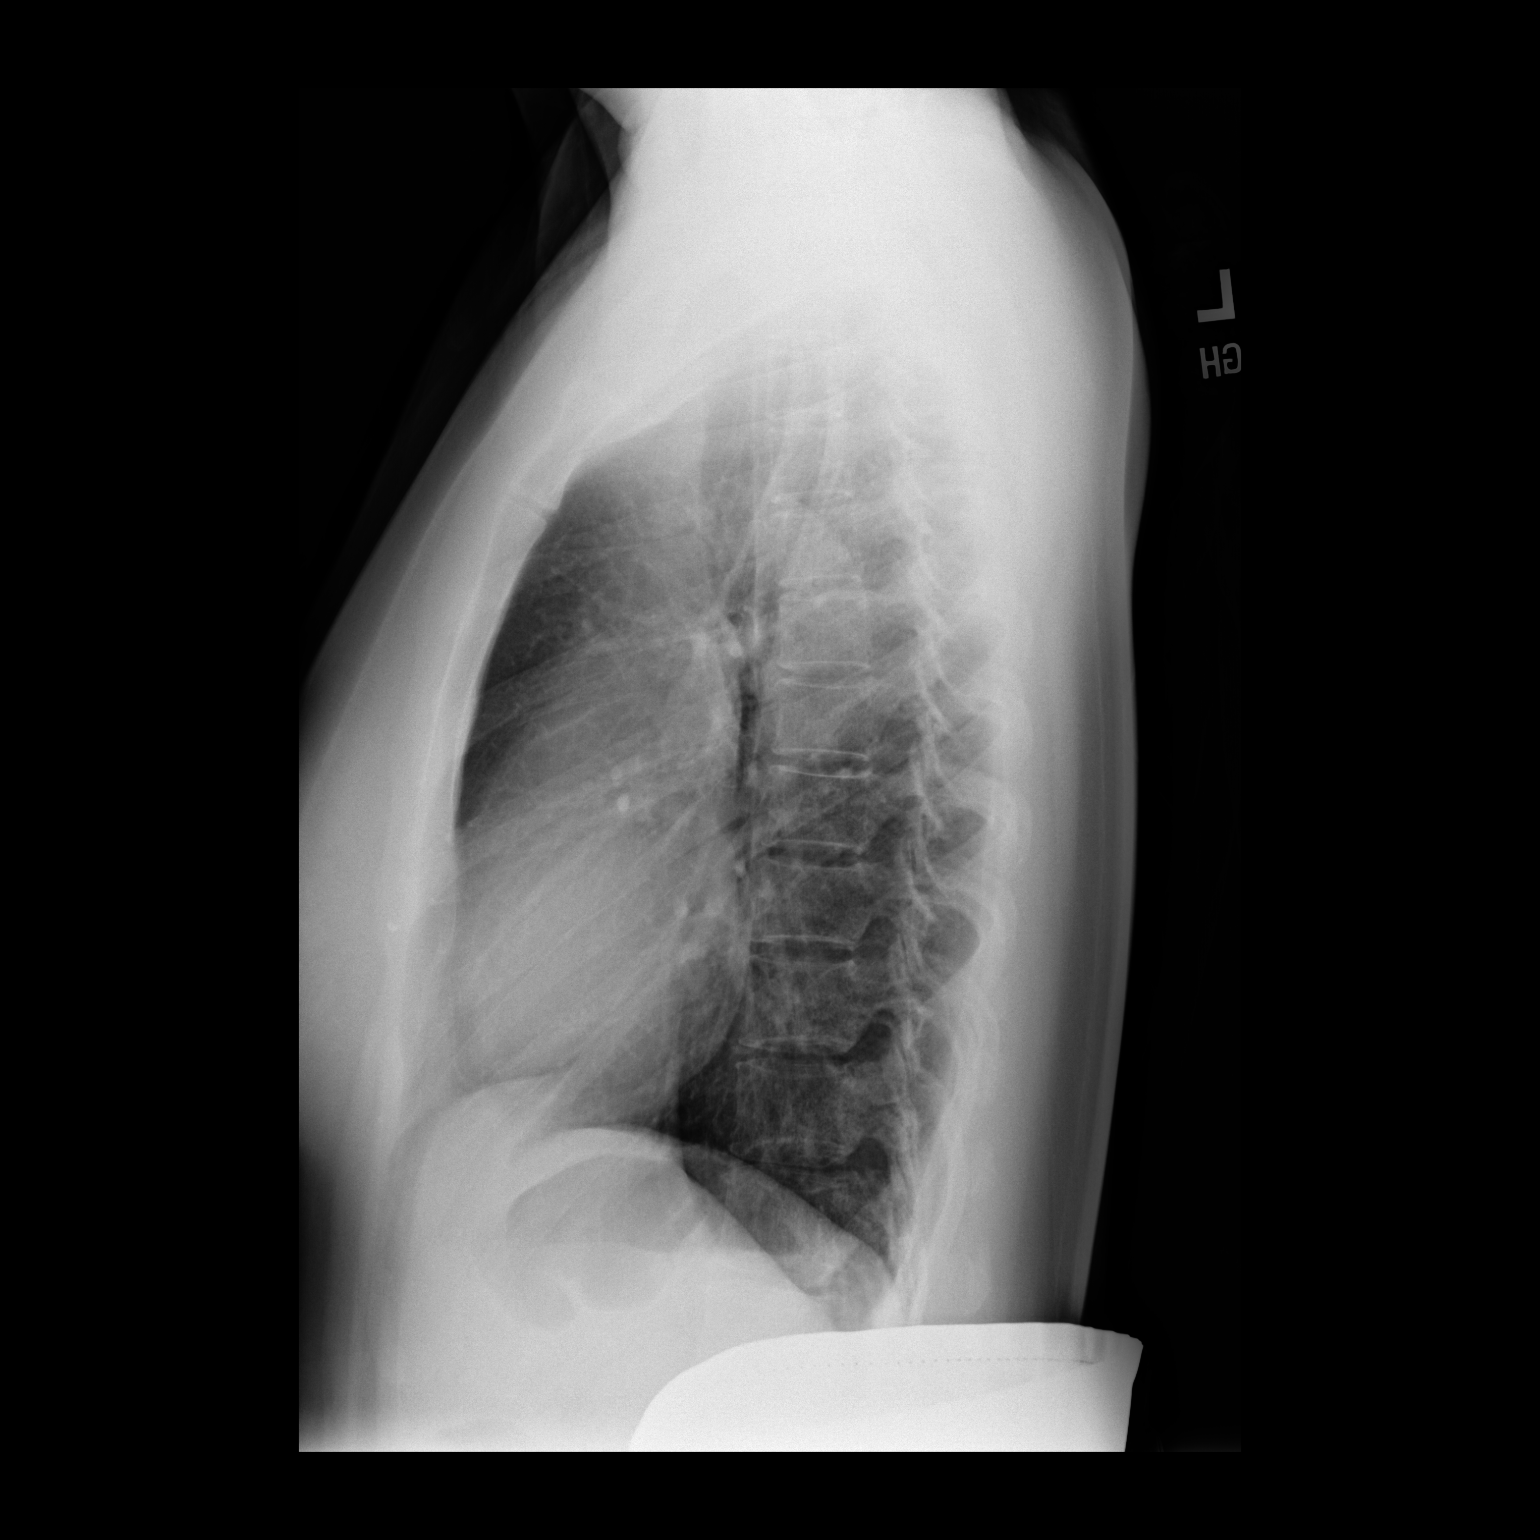

[2 of 2 positions shown; findings below may reference images not displayed]

FINDINGS: Cardiomediastinal silhouette unchanged in size and contour. No
evidence of central vascular congestion. No interlobular septal
thickening. No pneumothorax or pleural effusion. No confluent
airspace disease.

No acute displaced fracture
IMPRESSION: No active cardiopulmonary disease.

## 2024-05-13 DIAGNOSIS — J3081 Allergic rhinitis due to animal (cat) (dog) hair and dander: Secondary | ICD-10-CM | POA: Diagnosis not present

## 2024-05-13 DIAGNOSIS — J3089 Other allergic rhinitis: Secondary | ICD-10-CM | POA: Diagnosis not present

## 2024-05-13 DIAGNOSIS — J302 Other seasonal allergic rhinitis: Secondary | ICD-10-CM | POA: Diagnosis not present

## 2024-05-13 DIAGNOSIS — J301 Allergic rhinitis due to pollen: Secondary | ICD-10-CM | POA: Diagnosis not present

## 2024-05-13 NOTE — Progress Notes (Signed)
 VIALS MADE 05-13-24

## 2024-06-04 ENCOUNTER — Other Ambulatory Visit: Payer: Self-pay | Admitting: Family Medicine

## 2024-06-04 DIAGNOSIS — Z1231 Encounter for screening mammogram for malignant neoplasm of breast: Secondary | ICD-10-CM

## 2024-06-12 ENCOUNTER — Ambulatory Visit

## 2024-06-12 ENCOUNTER — Ambulatory Visit (INDEPENDENT_AMBULATORY_CARE_PROVIDER_SITE_OTHER): Payer: Self-pay

## 2024-06-12 ENCOUNTER — Telehealth: Payer: Self-pay

## 2024-06-12 DIAGNOSIS — J309 Allergic rhinitis, unspecified: Secondary | ICD-10-CM | POA: Diagnosis not present

## 2024-06-12 NOTE — Telephone Encounter (Signed)
 Patient was given 0.5 of DM-C-R and G-W-T. Patient stated after this vial, she will not be continuing the shots so no more needed to be made. She will fax over the treatment record and was advised that she will need to sign something that she is discontinuing shots at that time.

## 2024-07-21 ENCOUNTER — Ambulatory Visit
Admission: RE | Admit: 2024-07-21 | Discharge: 2024-07-21 | Disposition: A | Source: Ambulatory Visit | Attending: Family Medicine | Admitting: Family Medicine

## 2024-07-21 DIAGNOSIS — Z1231 Encounter for screening mammogram for malignant neoplasm of breast: Secondary | ICD-10-CM

## 2024-10-09 ENCOUNTER — Other Ambulatory Visit (HOSPITAL_BASED_OUTPATIENT_CLINIC_OR_DEPARTMENT_OTHER): Payer: Self-pay | Admitting: Family Medicine

## 2024-10-09 DIAGNOSIS — M542 Cervicalgia: Secondary | ICD-10-CM
